# Patient Record
Sex: Male | Born: 1985 | Race: Black or African American | Hispanic: No | Marital: Single | Smoking: Current every day smoker
Health system: Southern US, Community
[De-identification: ages and names within clinical notes are randomized; demographics above are authoritative.]

## PROBLEM LIST (undated history)

## (undated) ENCOUNTER — Ambulatory Visit (HOSPITAL_COMMUNITY): Discharge status: Absent without leave | Payer: No Payment, Other

## (undated) DIAGNOSIS — I10 Essential (primary) hypertension: Secondary | ICD-10-CM

## (undated) DIAGNOSIS — F419 Anxiety disorder, unspecified: Secondary | ICD-10-CM

## (undated) HISTORY — PX: NO PAST SURGERIES: SHX2092

---
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
## 2006-06-20 ENCOUNTER — Emergency Department (HOSPITAL_COMMUNITY): Admission: EM | Admit: 2006-06-20 | Discharge: 2006-06-20 | Payer: Self-pay | Admitting: Emergency Medicine

## 2006-09-14 ENCOUNTER — Emergency Department (HOSPITAL_COMMUNITY): Admission: EM | Admit: 2006-09-14 | Discharge: 2006-09-14 | Payer: Self-pay | Admitting: Emergency Medicine

## 2006-09-16 ENCOUNTER — Emergency Department (HOSPITAL_COMMUNITY): Admission: EM | Admit: 2006-09-16 | Discharge: 2006-09-16 | Payer: Self-pay | Admitting: Emergency Medicine

## 2006-09-20 ENCOUNTER — Emergency Department (HOSPITAL_COMMUNITY): Admission: EM | Admit: 2006-09-20 | Discharge: 2006-09-20 | Payer: Self-pay | Admitting: Emergency Medicine

## 2006-12-11 ENCOUNTER — Emergency Department (HOSPITAL_COMMUNITY): Admission: EM | Admit: 2006-12-11 | Discharge: 2006-12-12 | Payer: Self-pay | Admitting: Emergency Medicine

## 2008-02-21 ENCOUNTER — Emergency Department (HOSPITAL_COMMUNITY): Admission: EM | Admit: 2008-02-21 | Discharge: 2008-02-21 | Payer: Self-pay | Admitting: Emergency Medicine

## 2009-09-10 ENCOUNTER — Emergency Department (HOSPITAL_COMMUNITY): Admission: EM | Admit: 2009-09-10 | Discharge: 2009-09-10 | Payer: Self-pay | Admitting: Family Medicine

## 2010-10-26 LAB — POCT RAPID STREP A (OFFICE): Streptococcus, Group A Screen (Direct): POSITIVE — AB

## 2011-05-05 LAB — CBC
HCT: 49.9
MCHC: 34
MCV: 89.8
Platelets: 177
RDW: 13.2

## 2011-05-05 LAB — URINALYSIS, ROUTINE W REFLEX MICROSCOPIC
Bilirubin Urine: NEGATIVE
Glucose, UA: NEGATIVE
Nitrite: NEGATIVE
Protein, ur: NEGATIVE
pH: 8

## 2011-05-05 LAB — COMPREHENSIVE METABOLIC PANEL
Albumin: 4
CO2: 23

## 2011-05-05 LAB — DIFFERENTIAL
Basophils Absolute: 0
Eosinophils Relative: 0
Lymphocytes Relative: 18
Lymphs Abs: 0.9
Monocytes Absolute: 0.4
Monocytes Relative: 8
Neutro Abs: 3.6

## 2011-05-05 LAB — LIPASE, BLOOD: Lipase: 13

## 2011-08-25 ENCOUNTER — Emergency Department (HOSPITAL_COMMUNITY)
Admission: EM | Admit: 2011-08-25 | Discharge: 2011-08-25 | Disposition: A | Discharge status: Home / Self Care | Payer: Self-pay | Attending: Emergency Medicine | Admitting: Emergency Medicine

## 2011-08-25 ENCOUNTER — Encounter (HOSPITAL_COMMUNITY): Payer: Self-pay | Admitting: *Deleted

## 2011-08-25 DIAGNOSIS — R6883 Chills (without fever): Secondary | ICD-10-CM | POA: Insufficient documentation

## 2011-08-25 DIAGNOSIS — R070 Pain in throat: Secondary | ICD-10-CM | POA: Insufficient documentation

## 2011-08-25 DIAGNOSIS — J3489 Other specified disorders of nose and nasal sinuses: Secondary | ICD-10-CM | POA: Insufficient documentation

## 2011-08-25 DIAGNOSIS — I10 Essential (primary) hypertension: Secondary | ICD-10-CM | POA: Insufficient documentation

## 2011-08-25 DIAGNOSIS — J069 Acute upper respiratory infection, unspecified: Secondary | ICD-10-CM | POA: Insufficient documentation

## 2011-08-25 HISTORY — DX: Essential (primary) hypertension: I10

## 2011-08-25 MED ORDER — GUAIFENESIN-CODEINE 100-10 MG/5ML PO SYRP: 5.0000 mL | ORAL_SOLUTION | Freq: Three times a day (TID) | ORAL | Status: AC | PRN

## 2011-08-25 NOTE — ED Notes (Signed)
Cold sorethroat and head coingestion since yesterday

## 2011-09-01 NOTE — ED Provider Notes (Signed)
History    26 year old male with sore throat. Gradual onset yesterday. Persistent since. Feels like his face and head is congested. No fever. Has had intermittent chills. No difficulty with breathing or swallowing. Denies chest pain or shortness of breath. No unusual rash. No sick contacts. No nausea or vomiting. Patient is a smoker. Denies any significant past medical history.  CSN: [id number]  Arrival date & time 08/25/11  1630   First MD Initiated Contact with Patient 08/25/11 1709      Chief Complaint  Patient presents with  . Chills    (Consider location/radiation/quality/duration/timing/severity/associated sxs/prior treatment) HPI  Past Medical History  Diagnosis Date  . Hypertension     History reviewed. No pertinent past surgical history.  History reviewed. No pertinent family history.  History  Substance Use Topics  . Smoking status: Current Everyday Smoker  . Smokeless tobacco: Not on file  . Alcohol Use: Yes      Review of Systems   Review of symptoms negative unless otherwise noted in HPI.   Allergies  Review of patient's allergies indicates no known allergies.  Home Medications   Current Outpatient Rx  Name Route Sig Dispense Refill  . GUAIFENESIN-CODEINE 100-10 MG/5ML PO SYRP Oral Take 5 mLs by mouth 3 (three) times daily as needed for cough. 120 mL 0    BP 108/72  Pulse 75  Temp(Src) 99 F (37.2 C) (Oral)  Resp 20  SpO2 100%  Physical Exam  Nursing note and vitals reviewed. Constitutional: He appears well-developed and well-nourished. No distress.       Sitting up in bed. No acute distress.  HENT:  Head: Normocephalic and atraumatic.  Right Ear: External ear normal.  Left Ear: External ear normal.  Nose: Nose normal.  Mouth/Throat: Oropharynx is clear and moist.       Tympanic membranes are normal bilaterally. Posterior pharynx is clear. Uvula is midline. Patient is handling secretions. There is no tongue elevation. No  concerning  intraoral lesions. Submental tissues are soft. Neck is supple. No adenopathy.  Eyes: Conjunctivae are normal. Pupils are equal, round, and reactive to light. Right eye exhibits no discharge. Left eye exhibits no discharge.  Neck: Normal range of motion. Neck supple.  Cardiovascular: Normal rate, regular rhythm and normal heart sounds.  Exam reveals no gallop and no friction rub.   No murmur heard. Pulmonary/Chest: Effort normal and breath sounds normal. No stridor. No respiratory distress.  Abdominal: Soft. He exhibits no distension. There is no tenderness.  Musculoskeletal: He exhibits no edema and no tenderness.  Lymphadenopathy:    He has no cervical adenopathy.  Neurological: He is alert.  Skin: Skin is warm and dry. He is not diaphoretic.  Psychiatric: He has a normal mood and affect. His behavior is normal. Thought content normal.    ED Course  Procedures (including critical care time)  Labs Reviewed - No data to display No results found.   1. URI (upper respiratory infection)       MDM  26 year old male with a sore throat and congestion. Suspect viral URI. Patient is very well appearing. No evidence of deep space neck infection. No respiratory distress. Plan symptomatic treatment. Return precautions discussed. Outpatient follow up as needed.        Raeford Razor, MD 09/01/11 [phone]

## 2012-06-16 ENCOUNTER — Encounter (HOSPITAL_COMMUNITY): Payer: Self-pay | Admitting: Emergency Medicine

## 2012-06-16 ENCOUNTER — Emergency Department (HOSPITAL_COMMUNITY): Payer: Self-pay

## 2012-06-16 ENCOUNTER — Emergency Department (HOSPITAL_COMMUNITY)
Admission: EM | Admit: 2012-06-16 | Discharge: 2012-06-16 | Disposition: A | Discharge status: Home / Self Care | Payer: Self-pay | Attending: Emergency Medicine | Admitting: Emergency Medicine

## 2012-06-16 DIAGNOSIS — F419 Anxiety disorder, unspecified: Secondary | ICD-10-CM

## 2012-06-16 DIAGNOSIS — F411 Generalized anxiety disorder: Secondary | ICD-10-CM | POA: Insufficient documentation

## 2012-06-16 DIAGNOSIS — R0602 Shortness of breath: Secondary | ICD-10-CM | POA: Insufficient documentation

## 2012-06-16 DIAGNOSIS — I10 Essential (primary) hypertension: Secondary | ICD-10-CM | POA: Insufficient documentation

## 2012-06-16 DIAGNOSIS — F17200 Nicotine dependence, unspecified, uncomplicated: Secondary | ICD-10-CM | POA: Insufficient documentation

## 2012-06-16 HISTORY — DX: Anxiety disorder, unspecified: F41.9

## 2012-06-16 MED ORDER — LORAZEPAM 1 MG PO TABS
0.5000 mg | ORAL_TABLET | Freq: Once | ORAL | Status: AC
  Administered 2012-06-16: 0.5 mg via ORAL
  Filled 2012-06-16: qty 1

## 2012-06-16 MED ORDER — LORAZEPAM 1 MG PO TABS: 0.5000 mg | ORAL_TABLET | Freq: Three times a day (TID) | ORAL | Status: DC | PRN

## 2012-06-16 NOTE — ED Provider Notes (Signed)
History     CSN: [id number]  Arrival date & time 06/16/12  4098   First MD Initiated Contact with Patient 06/16/12 0636      Chief Complaint  Patient presents with  . Shortness of Breath    (Consider location/radiation/quality/duration/timing/severity/associated sxs/prior treatment) HPI Comments: Patient presents with a chief complaint of shortness of breath that began acutely at approximately 3 AM. Patient has a history of anxiety and states that the symptoms felt similar however he was having numbness and tingling around his mouth and in his fingers as well as a sharp pain in his left chest that was increased with deep breathing. He states that he was wheezing. Patient states he took a puff from a friend's steroid inhaler. He states that this helped somewhat. He does not have a history of asthma. Symptoms have gradually improved but are not gone. Nothing makes symptoms worse. Patient denies recent fever, cold symptoms, heartburn history of heart problems, nausea, vomiting, abdominal pain, diarrhea. He was prescribed xanax in the past for anxiety but no longer takes this because 'it was too strong'.   Patient is a 26 y.o. male presenting with shortness of breath. The history is provided by the patient.  Shortness of Breath  Associated symptoms include shortness of breath. Pertinent negatives include no chest pain, no fever, no rhinorrhea, no sore throat and no cough.    Past Medical History  Diagnosis Date  . Hypertension   . Anxiety     History reviewed. No pertinent past surgical history.  No family history on file.  History  Substance Use Topics  . Smoking status: Current Every Day Smoker  . Smokeless tobacco: Not on file  . Alcohol Use: Yes      Review of Systems  Constitutional: Negative for fever.  HENT: Negative for sore throat and rhinorrhea.   Eyes: Negative for redness.  Respiratory: Positive for shortness of breath. Negative for cough.   Cardiovascular:  Negative for chest pain.  Gastrointestinal: Negative for nausea, vomiting, abdominal pain and diarrhea.  Genitourinary: Negative for dysuria.  Musculoskeletal: Negative for myalgias.  Skin: Negative for rash.  Neurological: Negative for headaches.    Allergies  Review of patient's allergies indicates no known allergies.  Home Medications  No current outpatient prescriptions on file.  BP 124/89  Pulse 75  Temp 98.8 F (37.1 C)  Resp 18  SpO2 100%  Physical Exam  Nursing note and vitals reviewed. Constitutional: He appears well-developed and well-nourished.  HENT:  Head: Normocephalic and atraumatic.  Eyes: Conjunctivae normal are normal. Right eye exhibits no discharge. Left eye exhibits no discharge.  Neck: Normal range of motion. Neck supple.  Cardiovascular: Normal rate, regular rhythm and normal heart sounds.   Pulmonary/Chest: Effort normal and breath sounds normal.  Abdominal: Soft. There is no tenderness.  Neurological: He is alert.  Skin: Skin is warm and dry.  Psychiatric: He has a normal mood and affect.    ED Course  Procedures (including critical care time)  Labs Reviewed - No data to display Dg Chest 2 View  06/16/2012  *RADIOLOGY REPORT*  Clinical Data: Shortness of breath since this morning.  Chest tightness.  History of smoking.  CHEST - 2 VIEW  Comparison: 09/14/2006  Findings: Heart size is normal.  The lungs are free of focal consolidations and pleural effusions.  No pulmonary edema. Visualized osseous structures have a normal appearance.  IMPRESSION: Negative exam.   Original Report Authenticated By: Norva Pavlov, M.D.  1. Shortness of breath   2. Anxiety     7:02 AM Patient seen and examined. Work-up initiated. Medications ordered.   Vital signs reviewed and are as follows: Filed Vitals:   06/16/12 0633  BP: 124/89  Pulse: 75  Temp: 98.8 F (37.1 C)  Resp: 18    Date: 06/16/2012  Rate: 76  Rhythm: normal sinus rhythm  QRS  Axis: normal  Intervals: normal  ST/T Wave abnormalities: early repolarization  Conduction Disutrbances:none  Narrative Interpretation: LVH  Old EKG Reviewed: none available  7:52 AM EKG and findings reviewed with Dr. Jeraldine Loots  Patient informed of results.   Patient urged to return with worsening symptoms, trouble breathing or other concerns. Patient verbalized understanding and agrees with plan.   7:59 AM Patient feels better after 0.5mg  ativan. Requests rx for home. Pt counseled on use. Urged PCP follow-up.     MDM  Symptoms likely 2/2 panic attack. CXR and EKG unremarkable. EKG does show LVH and early repolarization. CXR is neg with normal heart size so doubt HCM. Patient is young, athletic build and this is likely why EKG shows LVH. Patient otherwise appears well.       Renne Crigler, [place] 06/16/12 [phone]

## 2012-06-16 NOTE — ED Provider Notes (Signed)
Medical screening examination/treatment/procedure(s) were performed by non-physician practitioner and as supervising physician I was immediately available for consultation/collaboration.  Avinash Maltos, MD 06/16/12 1003 

## 2012-06-16 NOTE — ED Notes (Signed)
Patient reports waking up from sleeping having difficulty catching his breath. Patient describes the feeling as an anxiety attack. Patient does have Hx of anxiety and has been prescribed Xanax but does not take this medication because "they are too strong." Patient talking in complete sentences and ambulating.

## 2013-09-14 ENCOUNTER — Emergency Department (HOSPITAL_COMMUNITY)
Admission: EM | Admit: 2013-09-14 | Discharge: 2013-09-14 | Disposition: A | Discharge status: Home / Self Care | Payer: Self-pay | Attending: Emergency Medicine | Admitting: Emergency Medicine

## 2013-09-14 ENCOUNTER — Encounter (HOSPITAL_COMMUNITY): Payer: Self-pay | Admitting: Emergency Medicine

## 2013-09-14 DIAGNOSIS — F17200 Nicotine dependence, unspecified, uncomplicated: Secondary | ICD-10-CM | POA: Insufficient documentation

## 2013-09-14 DIAGNOSIS — F411 Generalized anxiety disorder: Secondary | ICD-10-CM | POA: Insufficient documentation

## 2013-09-14 DIAGNOSIS — J069 Acute upper respiratory infection, unspecified: Secondary | ICD-10-CM | POA: Insufficient documentation

## 2013-09-14 DIAGNOSIS — R0982 Postnasal drip: Secondary | ICD-10-CM | POA: Insufficient documentation

## 2013-09-14 DIAGNOSIS — R110 Nausea: Secondary | ICD-10-CM | POA: Insufficient documentation

## 2013-09-14 DIAGNOSIS — R61 Generalized hyperhidrosis: Secondary | ICD-10-CM | POA: Insufficient documentation

## 2013-09-14 DIAGNOSIS — I10 Essential (primary) hypertension: Secondary | ICD-10-CM | POA: Insufficient documentation

## 2013-09-14 MED ORDER — OXYMETAZOLINE HCL 0.05 % NA SOLN
1.0000 | Freq: Once | NASAL | Status: AC
  Administered 2013-09-14: 1 via NASAL
  Filled 2013-09-14: qty 15

## 2013-09-14 MED ORDER — PSEUDOEPHEDRINE HCL 30 MG PO TABS: 30.0000 mg | ORAL_TABLET | ORAL | Status: DC | PRN

## 2013-09-14 NOTE — ED Provider Notes (Signed)
CSN: [id number]     Arrival date & time 09/14/13  1313 History  This chart was scribed for Jaynie Crumbleatyana Atwell Mcdanel, PA, working with Celene KrasJon R. Knapp, MD, by Ellin MayhewMichael Levi, ED Scribe. This patient was seen in room TR06C/TR06C and the patient's care was started at 1:49 PM.  Chief Complaint  Patient presents with  . Nasal Congestion  . Sore Throat   The history is provided by the patient. No language interpreter was used.   HPI Comments: Allen Henson is a 28 y.o. male who presents to the Emergency Department complaining of constant, nonchanging nasal congestion with clear drainage and sore throat with onset 3 days ago. Additionally, patient reports feeling sinus pressure, nauseous, and having hot flashes/chills. He denies any fever or cough. Patient has tried taking Mucinex and Ibuprofen with no relief. Patient confirms getting sick from his daughter recently. He is a current smoker. Patient confirms having a history of panic attacks.   Past Medical History  Diagnosis Date  . Hypertension   . Anxiety    History reviewed. No pertinent past surgical history. No family history on file. History  Substance Use Topics  . Smoking status: Current Every Day Smoker  . Smokeless tobacco: Not on file  . Alcohol Use: Yes     Comment: almost everyday    Review of Systems  Constitutional: Positive for chills and diaphoresis. Negative for fever.  HENT: Positive for congestion, postnasal drip, sinus pressure and sore throat.   Respiratory: Negative for cough and shortness of breath.   Gastrointestinal: Positive for nausea. Negative for vomiting and diarrhea.  Musculoskeletal: Negative for back pain and neck pain.  Neurological: Negative for weakness.  All other systems reviewed and are negative.   Allergies  Review of patient's allergies indicates no known allergies.  Home Medications   Current Outpatient Rx  Name  Route  Sig  Dispense  Refill  . LORazepam (ATIVAN) 1 MG tablet   Oral   Take 0.5  tablets (0.5 mg total) by mouth 3 (three) times daily as needed for anxiety.   5 tablet   0    Triage Vitals: BP 133/68  Pulse 70  Temp(Src) 98.1 F (36.7 C) (Oral)  Resp 20  SpO2 100%  Physical Exam  Nursing note and vitals reviewed. Constitutional: He is oriented to person, place, and time. He appears well-developed and well-nourished. No distress.  HENT:  Head: Normocephalic and atraumatic.  Right Ear: Hearing and tympanic membrane normal.  Left Ear: Hearing and tympanic membrane normal.  Mouth/Throat: No oropharyngeal exudate or posterior oropharyngeal erythema.  Post nasal drainage. Sinus non tender.   Eyes: EOM are normal.  Neck: Normal range of motion. Neck supple.  Cardiovascular: Normal rate, regular rhythm and normal heart sounds.   Pulmonary/Chest: Effort normal and breath sounds normal. No respiratory distress.  Musculoskeletal: Normal range of motion.  Neurological: He is alert and oriented to person, place, and time.  Skin: Skin is warm and dry.  Psychiatric: He has a normal mood and affect. His behavior is normal.    ED Course  Procedures (including critical care time)  DIAGNOSTIC STUDIES: Oxygen Saturation is 100% on room air, normal by my interpretation.    COORDINATION OF CARE: 1:54 PM-Discussed my suspicion of a viral infection. Encouraged taking a decongestant and tylenol in addition to saline spray to help with sinus congestion. Also encouraged to rest and refrain from working until symptoms resolve. Treatment plan discussed with patient and patient agrees.  Labs Review Labs  Reviewed - No data to display Imaging Review No results found.  EKG Interpretation   None       MDM   1. Viral URI    Pt with nasal congestion, facial pain, sinus pressure. No fever. Afebrile here in ED. VS normal. No cough. Suspect viral URI. Home on decongestants. Afrin given in ed.   Filed Vitals:   09/14/13 1326 09/14/13 1424  BP: 133/68 115/73  Pulse: 70 65   Temp: 98.1 F (36.7 C)   TempSrc: Oral   Resp: 20   SpO2: 100% 100%     I personally performed the services described in this documentation, which was scribed in my presence. The recorded information has been reviewed and is accurate.    Lottie Mussel, PA-C 09/14/13 1614

## 2013-09-14 NOTE — Discharge Instructions (Signed)
Rest. Drink plenty of fluids. Use saline spray every 2 hrs. Afrin spray, that was given in ED, use one spray, twice a day, USE ONLY FOR 3 DAYS. Ibuprofen or tylenol for pain. Sudafed for congestion.   Upper Respiratory Infection, Adult An upper respiratory infection (URI) is also known as the common cold. It is often caused by a type of germ (virus). Colds are easily spread (contagious). You can pass it to others by kissing, coughing, sneezing, or drinking out of the same glass. Usually, you get better in 1 or 2 weeks.  HOME CARE   Only take medicine as told by your doctor.  Use a warm mist humidifier or breathe in steam from a hot shower.  Drink enough water and fluids to keep your pee (urine) clear or pale yellow.  Get plenty of rest.  Return to work when your temperature is back to normal or as told by your doctor. You may use a face mask and wash your hands to stop your cold from spreading. GET HELP RIGHT AWAY IF:   After the first few days, you feel you are getting worse.  You have questions about your medicine.  You have chills, shortness of breath, or brown or red spit (mucus).  You have yellow or brown snot (nasal discharge) or pain in the face, especially when you bend forward.  You have a fever, puffy (swollen) neck, pain when you swallow, or white spots in the back of your throat.  You have a bad headache, ear pain, sinus pain, or chest pain.  You have a high-pitched whistling sound when you breathe in and out (wheezing).  You have a lasting cough or cough up blood.  You have sore muscles or a stiff neck. MAKE SURE YOU:   Understand these instructions.  Will watch your condition.  Will get help right away if you are not doing well or get worse. Document Released: 01/10/2008 Document Revised: 10/16/2011 Document Reviewed: 11/28/2010 [facility] Patient Information 2014 [place], [place].

## 2013-09-14 NOTE — ED Notes (Signed)
Pt is here with head congestion and sore throat

## 2013-09-16 NOTE — ED Provider Notes (Signed)
Medical screening examination/treatment/procedure(s) were performed by non-physician practitioner and as supervising physician I was immediately available for consultation/collaboration.    Kline Bulthuis R Shiza Thelen, MD 09/16/13 1436 

## 2014-01-15 ENCOUNTER — Emergency Department (HOSPITAL_COMMUNITY)
Admission: EM | Admit: 2014-01-15 | Discharge: 2014-01-15 | Discharge status: Against Medical Advice | Payer: Self-pay | Attending: Emergency Medicine | Admitting: Emergency Medicine

## 2014-01-15 ENCOUNTER — Encounter (HOSPITAL_COMMUNITY): Payer: Self-pay | Admitting: Emergency Medicine

## 2014-01-15 DIAGNOSIS — I10 Essential (primary) hypertension: Secondary | ICD-10-CM | POA: Insufficient documentation

## 2014-01-15 DIAGNOSIS — Z87891 Personal history of nicotine dependence: Secondary | ICD-10-CM | POA: Insufficient documentation

## 2014-01-15 DIAGNOSIS — F410 Panic disorder [episodic paroxysmal anxiety] without agoraphobia: Secondary | ICD-10-CM | POA: Insufficient documentation

## 2014-01-15 LAB — CBC
HCT: 45.7 % (ref 39.0–52.0)
HEMOGLOBIN: 15.1 g/dL (ref 13.0–17.0)
MCH: 29.7 pg (ref 26.0–34.0)
MCHC: 33 g/dL (ref 30.0–36.0)
MCV: 89.8 fL (ref 78.0–100.0)
PLATELETS: 198 10*3/uL (ref 150–400)
RBC: 5.09 MIL/uL (ref 4.22–5.81)
RDW: 12.8 % (ref 11.5–15.5)
WBC: 4.8 10*3/uL (ref 4.0–10.5)

## 2014-01-15 LAB — I-STAT TROPONIN, ED: Troponin i, poc: 0.01 ng/mL (ref 0.00–0.08)

## 2014-01-15 NOTE — ED Notes (Signed)
Pt with hx of panic attacks feels as if he is having one again.  States he feels like his chest is "ripping".  States ran out of anxiety meds.

## 2014-01-15 NOTE — ED Notes (Signed)
Pt has to work and he is going home.

## 2014-04-15 ENCOUNTER — Encounter (HOSPITAL_COMMUNITY): Payer: Self-pay | Admitting: Behavioral Health

## 2014-04-15 ENCOUNTER — Emergency Department (HOSPITAL_COMMUNITY)
Admission: EM | Admit: 2014-04-15 | Discharge: 2014-04-15 | Disposition: A | Discharge status: Home / Self Care | Payer: Self-pay | Attending: Emergency Medicine | Admitting: Emergency Medicine

## 2014-04-15 ENCOUNTER — Observation Stay (HOSPITAL_COMMUNITY)
Admission: AD | Admit: 2014-04-15 | Discharge: 2014-04-16 | Disposition: A | Discharge status: Home / Self Care | Payer: Self-pay | Source: Intra-hospital | Attending: Family | Admitting: Family

## 2014-04-15 ENCOUNTER — Encounter (HOSPITAL_COMMUNITY): Payer: Self-pay | Admitting: Emergency Medicine

## 2014-04-15 DIAGNOSIS — F1210 Cannabis abuse, uncomplicated: Secondary | ICD-10-CM | POA: Insufficient documentation

## 2014-04-15 DIAGNOSIS — F4325 Adjustment disorder with mixed disturbance of emotions and conduct: Principal | ICD-10-CM | POA: Insufficient documentation

## 2014-04-15 DIAGNOSIS — F911 Conduct disorder, childhood-onset type: Secondary | ICD-10-CM | POA: Insufficient documentation

## 2014-04-15 DIAGNOSIS — R454 Irritability and anger: Secondary | ICD-10-CM

## 2014-04-15 DIAGNOSIS — I10 Essential (primary) hypertension: Secondary | ICD-10-CM | POA: Insufficient documentation

## 2014-04-15 DIAGNOSIS — R45850 Homicidal ideations: Secondary | ICD-10-CM

## 2014-04-15 DIAGNOSIS — Z87891 Personal history of nicotine dependence: Secondary | ICD-10-CM | POA: Insufficient documentation

## 2014-04-15 DIAGNOSIS — F411 Generalized anxiety disorder: Secondary | ICD-10-CM | POA: Insufficient documentation

## 2014-04-15 DIAGNOSIS — F3289 Other specified depressive episodes: Secondary | ICD-10-CM | POA: Insufficient documentation

## 2014-04-15 DIAGNOSIS — F1410 Cocaine abuse, uncomplicated: Secondary | ICD-10-CM | POA: Insufficient documentation

## 2014-04-15 DIAGNOSIS — Z91040 Latex allergy status: Secondary | ICD-10-CM | POA: Insufficient documentation

## 2014-04-15 DIAGNOSIS — R112 Nausea with vomiting, unspecified: Secondary | ICD-10-CM | POA: Insufficient documentation

## 2014-04-15 DIAGNOSIS — R630 Anorexia: Secondary | ICD-10-CM | POA: Insufficient documentation

## 2014-04-15 DIAGNOSIS — F329 Major depressive disorder, single episode, unspecified: Secondary | ICD-10-CM | POA: Insufficient documentation

## 2014-04-15 DIAGNOSIS — F32A Depression, unspecified: Secondary | ICD-10-CM

## 2014-04-15 LAB — URINE MICROSCOPIC-ADD ON

## 2014-04-15 LAB — COMPREHENSIVE METABOLIC PANEL
ALBUMIN: 4.4 g/dL (ref 3.5–5.2)
ALT: 18 U/L (ref 0–53)
AST: 20 U/L (ref 0–37)
Alkaline Phosphatase: 54 U/L (ref 39–117)
Anion gap: 17 — ABNORMAL HIGH (ref 5–15)
BILIRUBIN TOTAL: 0.5 mg/dL (ref 0.3–1.2)
BUN: 10 mg/dL (ref 6–23)
CALCIUM: 9.3 mg/dL (ref 8.4–10.5)
CHLORIDE: 102 meq/L (ref 96–112)
CO2: 20 mEq/L (ref 19–32)
Creatinine, Ser: 1.12 mg/dL (ref 0.50–1.35)
GFR calc Af Amer: >90 mL/min (ref >90)
GFR calc non Af Amer: 89 mL/min — ABNORMAL LOW (ref >90)
GLUCOSE: 108 mg/dL — AB (ref 70–99)
POTASSIUM: 4.3 meq/L (ref 3.7–5.3)
Sodium: 139 mEq/L (ref 137–147)
Total Protein: 7.7 g/dL (ref 6.0–8.3)

## 2014-04-15 LAB — CBC WITH DIFFERENTIAL/PLATELET
Basophils Absolute: 0 10*3/uL (ref 0.0–0.1)
Basophils Relative: 0 % (ref 0–1)
EOS PCT: 0 % (ref 0–5)
Eosinophils Absolute: 0 10*3/uL (ref 0.0–0.7)
HCT: 49.6 % (ref 39.0–52.0)
HEMOGLOBIN: 16.4 g/dL (ref 13.0–17.0)
LYMPHS ABS: 1 10*3/uL (ref 0.7–4.0)
Lymphocytes Relative: 16 % (ref 12–46)
MCH: 29.6 pg (ref 26.0–34.0)
MCHC: 33.1 g/dL (ref 30.0–36.0)
MCV: 89.5 fL (ref 78.0–100.0)
Monocytes Absolute: 0.3 10*3/uL (ref 0.1–1.0)
Monocytes Relative: 6 % (ref 3–12)
Neutro Abs: 4.8 10*3/uL (ref 1.7–7.7)
Neutrophils Relative %: 78 % — ABNORMAL HIGH (ref 43–77)
PLATELETS: 184 10*3/uL (ref 150–400)
RBC: 5.54 MIL/uL (ref 4.22–5.81)
RDW: 12.5 % (ref 11.5–15.5)
WBC: 6.1 10*3/uL (ref 4.0–10.5)

## 2014-04-15 LAB — RAPID URINE DRUG SCREEN, HOSP PERFORMED
Amphetamines: NOT DETECTED
BARBITURATES: NOT DETECTED
BENZODIAZEPINES: NOT DETECTED
COCAINE: POSITIVE — AB
OPIATES: NOT DETECTED
TETRAHYDROCANNABINOL: POSITIVE — AB

## 2014-04-15 LAB — URINALYSIS, ROUTINE W REFLEX MICROSCOPIC
Bilirubin Urine: NEGATIVE
Glucose, UA: NEGATIVE mg/dL
Hgb urine dipstick: NEGATIVE
Ketones, ur: >80 mg/dL — AB
Nitrite: NEGATIVE
Protein, ur: NEGATIVE mg/dL
Specific Gravity, Urine: 1.029 (ref 1.005–1.030)
UROBILINOGEN UA: 0.2 mg/dL (ref 0.0–1.0)
pH: 5.5 (ref 5.0–8.0)

## 2014-04-15 LAB — ETHANOL

## 2014-04-15 LAB — HIV ANTIBODY (ROUTINE TESTING W REFLEX): HIV 1&2 Ab, 4th Generation: NONREACTIVE

## 2014-04-15 MED ORDER — ONDANSETRON 4 MG PO TBDP
4.0000 mg | ORAL_TABLET | Freq: Once | ORAL | Status: AC
  Administered 2014-04-15: 4 mg via ORAL
  Filled 2014-04-15: qty 1

## 2014-04-15 MED ORDER — ZOLPIDEM TARTRATE 5 MG PO TABS: 5.0000 mg | ORAL_TABLET | Freq: Every evening | ORAL | Status: DC | PRN

## 2014-04-15 MED ORDER — ACETAMINOPHEN 325 MG PO TABS: 650.0000 mg | ORAL_TABLET | ORAL | Status: DC | PRN

## 2014-04-15 MED ORDER — ONDANSETRON HCL 4 MG PO TABS: 4.0000 mg | ORAL_TABLET | Freq: Three times a day (TID) | ORAL | Status: DC | PRN

## 2014-04-15 MED ORDER — IBUPROFEN 600 MG PO TABS: 600.0000 mg | ORAL_TABLET | Freq: Three times a day (TID) | ORAL | Status: DC | PRN

## 2014-04-15 MED ORDER — MAGNESIUM HYDROXIDE 400 MG/5ML PO SUSP: 30.0000 mL | Freq: Every day | ORAL | Status: DC | PRN

## 2014-04-15 MED ORDER — HYDROXYZINE HCL 25 MG PO TABS: 25.0000 mg | ORAL_TABLET | Freq: Four times a day (QID) | ORAL | Status: DC | PRN

## 2014-04-15 MED ORDER — NICOTINE 21 MG/24HR TD PT24: 21.0000 mg | MEDICATED_PATCH | Freq: Every day | TRANSDERMAL | Status: DC

## 2014-04-15 MED ORDER — ALUM & MAG HYDROXIDE-SIMETH 200-200-20 MG/5ML PO SUSP: 30.0000 mL | ORAL | Status: DC | PRN

## 2014-04-15 MED ORDER — IBUPROFEN 200 MG PO TABS
600.0000 mg | ORAL_TABLET | Freq: Three times a day (TID) | ORAL | Status: DC | PRN
Start: 2014-04-15 — End: 2014-04-15

## 2014-04-15 MED ORDER — TRAZODONE HCL 50 MG PO TABS: 50.0000 mg | ORAL_TABLET | Freq: Every evening | ORAL | Status: DC | PRN

## 2014-04-15 MED ORDER — LORAZEPAM 1 MG PO TABS
1.0000 mg | ORAL_TABLET | Freq: Once | ORAL | Status: AC
  Administered 2014-04-15: 1 mg via ORAL
  Filled 2014-04-15: qty 1

## 2014-04-15 MED ORDER — NICOTINE 21 MG/24HR TD PT24
21.0000 mg | MEDICATED_PATCH | Freq: Every day | TRANSDERMAL | Status: DC
  Filled 2014-04-15 (×3): qty 1

## 2014-04-15 NOTE — ED Notes (Signed)
Called staffing for a sitter, they will work on finding one for him.

## 2014-04-15 NOTE — Plan of Care (Signed)
[facility] Observation Crisis Plan  Reason for Crisis Plan:  Crisis Stabilization   Plan of Care:  Referral for IOP  Family Support:   Mother and the mother of his two children  Current Living Environment:  Living Arrangements: Spouse/significant other;Parent  Insurance:   Hospital Account   Name Acct ID Class Status Primary Coverage   Allen Henson, Allen Henson [id number] BEHAVIORAL HEALTH OBSERVATION Open None        Guarantor Account (for Hospital Account [id number])   Name Relation to Pt Service Area Active? Acct Type   Allen Henson Self [facility] Yes Behavioral Health   Address Phone       [address] [place], [place] [address] [phone](H)          Coverage Information (for Hospital Account [id number])   Not on file      Legal Guardian:     Primary Care Provider:  No PCP Per Patient  Current Outpatient Providers:  none at this time  Psychiatrist:     Counselor/Therapist:     Compliant with Medications:  No  Additional Information:   Allen Henson 9/9/201511:19 PM

## 2014-04-15 NOTE — ED Notes (Signed)
Attempted report to pod C

## 2014-04-15 NOTE — ED Notes (Signed)
Pt's mother sts "I want to have him seen by a counselor, he just broke up with his baby mama and they have been together for a long time so she was like family".

## 2014-04-15 NOTE — ED Notes (Signed)
Called [facility] to see ETA and [facility] stated they were behind and will be another 1 1/2 hours before transport available.
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
## 2014-04-15 NOTE — Discharge Instructions (Signed)
YOU ARE BEING TRANSPORTED FOR ONGOING PSYCHIATRIC CARE.
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
## 2014-04-15 NOTE — ED Notes (Signed)
[facility] called to say they were on their way.

## 2014-04-15 NOTE — Progress Notes (Signed)
[facility] INPATIENT:  Family/Significant Other Suicide Prevention Education  Suicide Prevention Education:  Patient Refusal for Family/Significant Other Suicide Prevention Education: The patient Allen Allen has refused to provide written consent for family/significant other to be provided Family/Significant Other Suicide Prevention Education during admission and/or prior to discharge.   Angeline Slim M 04/15/2014, 11:33 PM

## 2014-04-15 NOTE — ED Notes (Addendum)
Pt c/o lower abd pain, nausea and diarrhea x 2 days, sts he isn't really able to keep much down, denies any friends/family members with similar symptoms. Pt c/o feeling depressed and anxious. Reports difficulty sleeping/eating because of this, reports he just wants to sit around and no nothing, sts " I just feel down". Denies SI/HI. Reports he used to take xanax and ativan for anxiety but hasn't taken it in a few years. Denies currently seeing anyone for depression/anxiety. Pt reports when he gets really mad then he gets anxious. Nad, skin warm and dry, resp e/u.
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
## 2014-04-15 NOTE — ED Notes (Signed)
Security aware of need to wand pt.
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
## 2014-04-15 NOTE — BH Assessment (Signed)
Per Conrad,NP - patient meets criteria for OBS.  Patient accepted to OBS Bed 3.  Writer informed the ER MD and nurse. The nurse will arrange transportation through [facility].  The number [phone] or [phone].  Writer faxed support paperwork to [facility].

## 2014-04-15 NOTE — ED Notes (Signed)
Pt here with mother with increased anxiety and N/V; pt recent break up with significant other and having increased anger towards person that involved in break up; pt with poor eye contact and hx of anxiety per mother; pt denies SI/HI
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
## 2014-04-16 DIAGNOSIS — F4325 Adjustment disorder with mixed disturbance of emotions and conduct: Principal | ICD-10-CM

## 2014-04-16 LAB — GC/CHLAMYDIA PROBE AMP
CT PROBE, AMP APTIMA: POSITIVE — AB
GC PROBE AMP APTIMA: NEGATIVE

## 2014-04-16 NOTE — Consult Note (Signed)
Case discussed, agree with plan 

## 2014-04-16 NOTE — Discharge Instructions (Signed)
For your ongoing behavioral health needs you may benefit from contacting one of the following agencies for outpatient treatment:       [facility]      [address]      [place], [place] [address]      [phone]       [facility]      [address]      [place], [place] [address]      [phone]      Please note: they also have an office in [place].  Call for appointment and for location of [facility] office

## 2014-04-16 NOTE — Progress Notes (Signed)
Patient ID: Allen Henson, male   DOB: 1986/03/13, 27 y.o.   MRN: [id number] Discharge Note-Seen this am by Renata Caprice NP and he has determined that client is safe and will be discharged today. He has already called his Mom to transport him home He states he felt better yesterday while still in ED because his baby's Momma visited him in the ED yesterday and they resolved their conflict. He denies being dangerous to anyone or himself now.He denies any offer to follow up with an outpatient provider, states he doesn't need it.All property returned to him and escorted to lobby to wait for his cab which his mom arranged for him. No prescriptions sent home with him. He asked Sanjuan Dame dissposition coordinator for outpatient referrals and they were given to him. Reviewed with him his outpatient referrals and he verbalized his understanding.

## 2014-04-16 NOTE — Progress Notes (Signed)
Patient ID: Allen Henson, male   DOB: 05-08-1986, 28 y.o.   MRN: [id number] D-States on initial contact that he is going home today before noon, and he was told that by the guy that admitted him here. He states he is here because he just needed to rest a night, because he was upset re relational issues. He states he has somewhere to live, is working as a Scientist, physiological and has 300 dollars in the locker to help him get home. His affect is bright, he is pleasant. He is denying any thoughts to hurt self or others today. A-Support offered. He denies any outpatient treatment and isnt interested in getting any at this time. No medications ordered. Monitored for safety. 0-Waiting on the Dr. To see him and recommended dissposition.
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
## 2014-04-16 NOTE — ED Provider Notes (Signed)
Medical screening examination/treatment/procedure(s) were performed by non-physician practitioner and as supervising physician I was immediately available for consultation/collaboration.   EKG Interpretation None        Pao Haffey, MD 04/16/14 0738 

## 2014-04-16 NOTE — Discharge Summary (Signed)
[facility] UNIT DISCHARGE SUMMARY & SRA   *Pt seen once in OBS due to short length of stay  Reason for Consult:  Homicidal Ideation Referring Physician:  EDP Allen Henson is an 28 y.o. male. Total Time spent with patient: 45 minutes  Assessment: AXIS I:  Adjustment Disorder with Mixed Disturbance of Emotions and Conduct AXIS II:  Deferred AXIS III:   Past Medical History  Diagnosis Date  . Hypertension   . Anxiety    AXIS IV:  other psychosocial or environmental problems, problems related to legal system/crime and problems related to social environment AXIS V:  61-70 mild symptoms  Plan:  No evidence of imminent risk to self or others at present.   Patient does not meet criteria for psychiatric inpatient admission. Supportive therapy provided about ongoing stressors. Refer to IOP. Discussed crisis plan, support from social network, calling 911, coming to the Emergency Department, and calling Suicide Hotline.    Subjective:   Allen Henson is a 28 y.o. male patient admitted with Homicidal Ideation towards the mother of his child and the person in a relationship with her. Pt denies SI, HI, and AVH, contracts for safety. Pt reports that he "worked everything out with my baby's mama and we are doing very well. I'm feeling much better". Pt spent the night in the OBS UNIT and is stable for discharge with outpatient referrals.   HPI:  Patient to the ER with complaints of anxiety, butterflies in his stomach, anger, anorexia with nausea and vomiting. The patient says that he was unfaithful to his babys mother last year. Four days ago the girl he slept with came for and told his babys mother about the incident last year. He says that his baby mother left him now and keeps telling him that she is seeing someone else now. He says that he has been to prison more than once because he has a bad temper and anger issues. He is concerned right now that if he leaves he will find the guy she is dating  or the girl he slept with last year and physically harm them. He reports not being able to go home because he is so upset and feels like he can't breathe and that his stomach is in knots. Pt denies SI but admits to wanting to harm others, anxiety and depression. Denies substance abuse.   HPI Elements:   Location:  Psychiatric. Quality:  Worsening. Severity:  Severe. Timing:  Constant. Duration:  New onset, persisting. Context:  Exacerbation of underlying concerns with anger management (pt reports 5 anger management courses).  Past Psychiatric History: Past Medical History  Diagnosis Date  . Hypertension   . Anxiety     reports that he quit smoking about 6 months ago. He does not have any smokeless tobacco history on file. He reports that he drinks alcohol. He reports that he uses illicit drugs (Marijuana). History reviewed. No pertinent family history.   Living Arrangements: Spouse/significant other;Parent   Abuse/Neglect [facility]) Physical Abuse: Denies Verbal Abuse: Denies Sexual Abuse: Denies Allergies:   Allergies  Allergen Reactions  . Latex Hives and Other (See Comments)    Headache    ACT Assessment Complete:  Yes:    Educational Status    Risk to Self: Risk to self with the past 6 months Is patient at risk for suicide?: No  Risk to Others:    Abuse: Abuse/Neglect Assessment (Assessment to be complete while patient is alone) Physical Abuse: Denies Verbal Abuse:  Denies Sexual Abuse: Denies Exploitation of patient/patient's resources: Denies Self-Neglect: Denies  Prior Inpatient Therapy:    Prior Outpatient Therapy:    Additional Information:                    Objective: Blood pressure 108/71, pulse 59, temperature 98.3 F (36.8 C), temperature source Oral, resp. rate 18, height 5' 9"  (1.753 m), weight 80.74 kg (178 lb), SpO2 100.00%.Body mass index is 26.27 kg/(m^2). Results for orders placed during the hospital encounter of 04/15/14 (from the past  72 hour(s))  URINALYSIS, ROUTINE W REFLEX MICROSCOPIC     Status: Abnormal   Collection Time    04/15/14 11:33 AM      Result Value Ref Range   Color, Urine AMBER (*) YELLOW   Comment: BIOCHEMICALS MAY BE AFFECTED BY COLOR   APPearance CLOUDY (*) CLEAR   Specific Gravity, Urine 1.029  1.005 - 1.030   pH 5.5  5.0 - 8.0   Glucose, UA NEGATIVE  NEGATIVE mg/dL   Hgb urine dipstick NEGATIVE  NEGATIVE   Bilirubin Urine NEGATIVE  NEGATIVE   Ketones, ur >80 (*) NEGATIVE mg/dL   Protein, ur NEGATIVE  NEGATIVE mg/dL   Urobilinogen, UA 0.2  0.0 - 1.0 mg/dL   Nitrite NEGATIVE  NEGATIVE   Leukocytes, UA SMALL (*) NEGATIVE  URINE MICROSCOPIC-ADD ON     Status: None   Collection Time    04/15/14 11:33 AM      Result Value Ref Range   Squamous Epithelial / LPF RARE  RARE   WBC, UA 7-10  <3 WBC/hpf   Bacteria, UA RARE  RARE   Urine-Other MUCOUS PRESENT    GC/CHLAMYDIA PROBE AMP     Status: Abnormal   Collection Time    04/15/14 12:42 PM      Result Value Ref Range   CT Probe RNA POSITIVE (*) NEGATIVE   Comment: (NOTE)     A Positive CT or NG Nucleic Acid Amplification Test (NAAT) result     should be considered presumptive evidence of infection.  The result     should be evaluated along with physical examination and other     diagnostic findings.   GC Probe RNA NEGATIVE  NEGATIVE   Comment: (NOTE)                                                                                               **Normal Reference Range: Negative**          Assay performed using the Gen-Probe APTIMA COMBO2 (R) Assay.     Acceptable specimen types for this assay include APTIMA Swabs (Unisex,     endocervical, urethral, or vaginal), first void urine, and ThinPrep     liquid based cytology samples.     Performed at [facility]     Status: None   Collection Time    04/15/14 12:42 PM      Result Value Ref Range   Alcohol, Ethyl (B) <11  0 - 11 mg/dL   Comment:  LOWEST DETECTABLE  LIMIT FOR     SERUM ALCOHOL IS 11 mg/dL     FOR MEDICAL PURPOSES ONLY  COMPREHENSIVE METABOLIC PANEL     Status: Abnormal   Collection Time    04/15/14 12:42 PM      Result Value Ref Range   Sodium 139  137 - 147 mEq/L   Potassium 4.3  3.7 - 5.3 mEq/L   Chloride 102  96 - 112 mEq/L   CO2 20  19 - 32 mEq/L   Glucose, Bld 108 (*) 70 - 99 mg/dL   BUN 10  6 - 23 mg/dL   Creatinine, Ser 1.12  0.50 - 1.35 mg/dL   Calcium 9.3  8.4 - 10.5 mg/dL   Total Protein 7.7  6.0 - 8.3 g/dL   Albumin 4.4  3.5 - 5.2 g/dL   AST 20  0 - 37 U/L   ALT 18  0 - 53 U/L   Alkaline Phosphatase 54  39 - 117 U/L   Total Bilirubin 0.5  0.3 - 1.2 mg/dL   GFR calc non Af Amer 89 (*) >90 mL/min   GFR calc Af Amer >90  >90 mL/min   Comment: (NOTE)     The eGFR has been calculated using the CKD EPI equation.     This calculation has not been validated in all clinical situations.     eGFR's persistently <90 mL/min signify possible Chronic Kidney     Disease.   Anion gap 17 (*) 5 - 15  CBC WITH DIFFERENTIAL     Status: Abnormal   Collection Time    04/15/14 12:42 PM      Result Value Ref Range   WBC 6.1  4.0 - 10.5 K/uL   RBC 5.54  4.22 - 5.81 MIL/uL   Hemoglobin 16.4  13.0 - 17.0 g/dL   HCT 49.6  39.0 - 52.0 %   MCV 89.5  78.0 - 100.0 fL   MCH 29.6  26.0 - 34.0 pg   MCHC 33.1  30.0 - 36.0 g/dL   RDW 12.5  11.5 - 15.5 %   Platelets 184  150 - 400 K/uL   Neutrophils Relative % 78 (*) 43 - 77 %   Neutro Abs 4.8  1.7 - 7.7 K/uL   Lymphocytes Relative 16  12 - 46 %   Lymphs Abs 1.0  0.7 - 4.0 K/uL   Monocytes Relative 6  3 - 12 %   Monocytes Absolute 0.3  0.1 - 1.0 K/uL   Eosinophils Relative 0  0 - 5 %   Eosinophils Absolute 0.0  0.0 - 0.7 K/uL   Basophils Relative 0  0 - 1 %   Basophils Absolute 0.0  0.0 - 0.1 K/uL  HIV ANTIBODY (ROUTINE TESTING)     Status: None   Collection Time    04/15/14 12:42 PM      Result Value Ref Range   HIV 1&2 Ab, 4th Generation NONREACTIVE  NONREACTIVE   Comment: (NOTE)      A NONREACTIVE HIV Ag/Ab result does not exclude HIV infection since     the time frame for seroconversion is variable. If acute HIV infection     is suspected, a HIV-1 RNA Qualitative TMA test is recommended.     HIV-1/2 Antibody Diff         Not indicated.     HIV-1 RNA, Qual TMA           Not indicated.  PLEASE NOTE: This information has been disclosed to you from records     whose confidentiality may be protected by state law. If your state     requires such protection, then the state law prohibits you from making     any further disclosure of the information without the specific written     consent of the person to whom it pertains, or as otherwise permitted     by law. A general authorization for the release of medical or other     information is NOT sufficient for this purpose.     The performance of this assay has not been clinically validated in     patients less than 48 years old.     Performed at [facility] (HOSP PERFORMED)     Status: Abnormal   Collection Time    04/15/14  1:33 PM      Result Value Ref Range   Opiates NONE DETECTED  NONE DETECTED   Cocaine POSITIVE (*) NONE DETECTED   Benzodiazepines NONE DETECTED  NONE DETECTED   Amphetamines NONE DETECTED  NONE DETECTED   Tetrahydrocannabinol POSITIVE (*) NONE DETECTED   Barbiturates NONE DETECTED  NONE DETECTED   Comment:            DRUG SCREEN FOR MEDICAL PURPOSES     ONLY.  IF CONFIRMATION IS NEEDED     FOR ANY PURPOSE, NOTIFY LAB     WITHIN 5 DAYS.                LOWEST DETECTABLE LIMITS     FOR URINE DRUG SCREEN     Drug Class       Cutoff (ng/mL)     Amphetamine      1000     Barbiturate      200     Benzodiazepine   702     Tricyclics       637     Opiates          300     Cocaine          300     THC              50   Labs are reviewed and are pertinent for N/A.  Current Facility-Administered Medications  Medication Dose Route Frequency Provider Last Rate  Last Dose  . acetaminophen (TYLENOL) tablet 650 mg  650 mg Oral Q4H PRN Benjamine Mola, FNP      . alum & mag hydroxide-simeth (MAALOX/MYLANTA) 200-200-20 MG/5ML suspension 30 mL  30 mL Oral PRN Benjamine Mola, FNP      . hydrOXYzine (ATARAX/VISTARIL) tablet 25 mg  25 mg Oral Q6H PRN Benjamine Mola, FNP      . ibuprofen (ADVIL,MOTRIN) tablet 600 mg  600 mg Oral Q8H PRN Benjamine Mola, FNP      . magnesium hydroxide (MILK OF MAGNESIA) suspension 30 mL  30 mL Oral Daily PRN Benjamine Mola, FNP      . nicotine (NICODERM CQ - dosed in mg/24 hours) patch 21 mg  21 mg Transdermal Daily Benjamine Mola, FNP      . ondansetron (ZOFRAN) tablet 4 mg  4 mg Oral Q8H PRN Benjamine Mola, FNP      . traZODone (DESYREL) tablet 50 mg  50 mg Oral QHS PRN Benjamine Mola, FNP        Psychiatric Specialty Exam:  Blood pressure 108/71, pulse 59, temperature 98.3 F (36.8 C), temperature source Oral, resp. rate 18, height 5' 9"  (1.753 m), weight 80.74 kg (178 lb), SpO2 100.00%.Body mass index is 26.27 kg/(m^2).  General Appearance: Casual  Eye Contact::  Good  Speech:  Clear and Coherent  Volume:  Normal  Mood:  Euthymic  Affect:  Appropriate and Congruent  Thought Process:  Coherent and Goal Directed  Orientation:  Full (Time, Place, and Person)  Thought Content:  WDL  Suicidal Thoughts:  No  Homicidal Thoughts:  No  Memory:  Immediate;   Good Recent;   Good Remote;   Good  Judgement:  Fair  Insight:  Fair  Psychomotor Activity:  Normal  Concentration:  Good  Recall:  Good  Fund of Knowledge:Fair  Language: Good  Akathisia:  No  Handed:    AIMS (if indicated):     Assets:  Resilience Social Support  Sleep:      Musculoskeletal: Strength & Muscle Tone: within normal limits Gait & Station: normal Patient leans: N/A    Suicide Risk Assessment     Nursing information obtained from:  Patient Demographic factors:  Male;Unemployed Current Mental Status:  Thoughts of violence towards  others (prior to admission to obs) Loss Factors:  NA Historical Factors:  NA Risk Reduction Factors:  Responsible for children under 58 years of age;Sense of responsibility to family;Religious beliefs about death;Living with another person, especially a relative;Positive social support;Positive therapeutic relationship Total Time spent with patient: 45 minutes  CLINICAL FACTORS:   Depression:   Impulsivity Insomnia  Psychiatric Specialty Exam:     Blood pressure 108/71, pulse 59, temperature 98.3 F (36.8 C), temperature source Oral, resp. rate 18, height 5' 9"  (1.753 m), weight 80.74 kg (178 lb), SpO2 100.00%.Body mass index is 26.27 kg/(m^2).  SEE PSE ABOVE  COGNITIVE FEATURES THAT CONTRIBUTE TO RISK:  Closed-mindedness    SUICIDE RISK:   Minimal: No identifiable suicidal ideation.  Patients presenting with no risk factors but with morbid ruminations; may be classified as minimal risk based on the severity of the depressive symptoms  Treatment Plan Summary: -Discharge home with outpatient referrals to counseling/psychiatry.   Benjamine Mola, FNP-BC 04/16/2014 11:14 AM  *Case reviewed with Dr. Sabra Heck.  Personally evaluated the patient and agree with assessment and plan Geralyn Flash A. Sabra Heck, M.D.

## 2014-04-16 NOTE — H&P (Signed)
[place] OBS UNIT H&P  Reason for Consult:  Homicidal Ideation Referring Physician:  EDP Allen Henson is an 28 y.o. male. Total Time spent with patient: 25 minutes  Assessment: AXIS I:  Adjustment Disorder with Mixed Disturbance of Emotions and Conduct AXIS II:  Deferred AXIS III:   Past Medical History  Diagnosis Date  . Hypertension   . Anxiety    AXIS IV:  other psychosocial or environmental problems, problems related to legal system/crime and problems related to social environment AXIS V:  61-70 mild symptoms  Plan:  No evidence of imminent risk to self or others at present.   Patient does not meet criteria for psychiatric inpatient admission. Supportive therapy provided about ongoing stressors. Refer to IOP. Discussed crisis plan, support from social network, calling 911, coming to the Emergency Department, and calling Suicide Hotline.    Subjective:   Allen Henson is a 28 y.o. male patient admitted with Homicidal Ideation towards the mother of his child and the person in a relationship with her. Pt denies SI, HI, and AVH, contracts for safety. Pt reports that he "worked everything out with my baby's mama and we are doing very well. I'm feeling much better". Pt spent the night in the OBS UNIT and is stable for discharge with outpatient referrals.   HPI:  Patient to the ER with complaints of anxiety, butterflies in his stomach, anger, anorexia with nausea and vomiting. The patient says that he was unfaithful to his babys mother last year. Four days ago the girl he slept with came for and told his babys mother about the incident last year. He says that his baby mother left him now and keeps telling him that she is seeing someone else now. He says that he has been to prison more than once because he has a bad temper and anger issues. He is concerned right now that if he leaves he will find the guy she is dating or the girl he slept with last year and physically harm them. He reports  not being able to go home because he is so upset and feels like he can't breathe and that his stomach is in knots. Pt denies SI but admits to wanting to harm others, anxiety and depression. Denies substance abuse.   HPI Elements:   Location:  Psychiatric. Quality:  Worsening. Severity:  Severe. Timing:  Constant. Duration:  New onset, persisting. Context:  Exacerbation of underlying concerns with anger management (pt reports 5 anger management courses).  Past Psychiatric History: Past Medical History  Diagnosis Date  . Hypertension   . Anxiety     reports that he quit smoking about 6 months ago. He does not have any smokeless tobacco history on file. He reports that he drinks alcohol. He reports that he uses illicit drugs (Marijuana). History reviewed. No pertinent family history.   Living Arrangements: Spouse/significant other;Parent   Abuse/Neglect [facility]) Physical Abuse: Denies Verbal Abuse: Denies Sexual Abuse: Denies Allergies:   Allergies  Allergen Reactions  . Latex Hives and Other (See Comments)    Headache    ACT Assessment Complete:  Yes:    Educational Status    Risk to Self: Risk to self with the past 6 months Is patient at risk for suicide?: No  Risk to Others:    Abuse: Abuse/Neglect Assessment (Assessment to be complete while patient is alone) Physical Abuse: Denies Verbal Abuse: Denies Sexual Abuse: Denies Exploitation of patient/patient's resources: Denies Self-Neglect: Denies  Prior Inpatient Therapy:  Prior Outpatient Therapy:    Additional Information:                    Objective: Blood pressure 108/71, pulse 59, temperature 98.3 F (36.8 C), temperature source Oral, resp. rate 18, height _0  (1.753 m), weight 80.74 kg (178 lb), SpO2 100.00%.Body mass index is 26.27 kg/(m^2). Results for orders placed during the hospital encounter of 04/15/14 (from the past 72 hour(s))  URINALYSIS, ROUTINE W REFLEX MICROSCOPIC     Status:  Abnormal   Collection Time    04/15/14 11:33 AM      Result Value Ref Range   Color, Urine AMBER (*) YELLOW   Comment: BIOCHEMICALS MAY BE AFFECTED BY COLOR   APPearance CLOUDY (*) CLEAR   Specific Gravity, Urine 1.029  1.005 - 1.030   pH 5.5  5.0 - 8.0   Glucose, UA NEGATIVE  NEGATIVE mg/dL   Hgb urine dipstick NEGATIVE  NEGATIVE   Bilirubin Urine NEGATIVE  NEGATIVE   Ketones, ur >80 (*) NEGATIVE mg/dL   Protein, ur NEGATIVE  NEGATIVE mg/dL   Urobilinogen, UA 0.2  0.0 - 1.0 mg/dL   Nitrite NEGATIVE  NEGATIVE   Leukocytes, UA SMALL (*) NEGATIVE  URINE MICROSCOPIC-ADD ON     Status: None   Collection Time    04/15/14 11:33 AM      Result Value Ref Range   Squamous Epithelial / LPF RARE  RARE   WBC, UA 7-10  <3 WBC/hpf   Bacteria, UA RARE  RARE   Urine-Other MUCOUS PRESENT    GC/CHLAMYDIA PROBE AMP     Status: Abnormal   Collection Time    04/15/14 12:42 PM      Result Value Ref Range   CT Probe RNA POSITIVE (*) NEGATIVE   Comment: (NOTE)     A Positive CT or NG Nucleic Acid Amplification Test (NAAT) result     should be considered presumptive evidence of infection.  The result     should be evaluated along with physical examination and other     diagnostic findings.   GC Probe RNA NEGATIVE  NEGATIVE   Comment: (NOTE)                                                                                               **Normal Reference Range: Negative**          Assay performed using the Gen-Probe APTIMA COMBO2 (R) Assay.     Acceptable specimen types for this assay include APTIMA Swabs (Unisex,     endocervical, urethral, or vaginal), first void urine, and ThinPrep     liquid based cytology samples.     Performed at [facility]     Status: None   Collection Time    04/15/14 12:42 PM      Result Value Ref Range   Alcohol, Ethyl (B) <11  0 - 11 mg/dL   Comment:            LOWEST DETECTABLE LIMIT FOR     SERUM ALCOHOL IS 11 mg/dL  FOR MEDICAL PURPOSES  ONLY  COMPREHENSIVE METABOLIC PANEL     Status: Abnormal   Collection Time    04/15/14 12:42 PM      Result Value Ref Range   Sodium 139  137 - 147 mEq/L   Potassium 4.3  3.7 - 5.3 mEq/L   Chloride 102  96 - 112 mEq/L   CO2 20  19 - 32 mEq/L   Glucose, Bld 108 (*) 70 - 99 mg/dL   BUN 10  6 - 23 mg/dL   Creatinine, Ser 1.12  0.50 - 1.35 mg/dL   Calcium 9.3  8.4 - 10.5 mg/dL   Total Protein 7.7  6.0 - 8.3 g/dL   Albumin 4.4  3.5 - 5.2 g/dL   AST 20  0 - 37 U/L   ALT 18  0 - 53 U/L   Alkaline Phosphatase 54  39 - 117 U/L   Total Bilirubin 0.5  0.3 - 1.2 mg/dL   GFR calc non Af Amer 89 (*) >90 mL/min   GFR calc Af Amer >90  >90 mL/min   Comment: (NOTE)     The eGFR has been calculated using the CKD EPI equation.     This calculation has not been validated in all clinical situations.     eGFR's persistently <90 mL/min signify possible Chronic Kidney     Disease.   Anion gap 17 (*) 5 - 15  CBC WITH DIFFERENTIAL     Status: Abnormal   Collection Time    04/15/14 12:42 PM      Result Value Ref Range   WBC 6.1  4.0 - 10.5 K/uL   RBC 5.54  4.22 - 5.81 MIL/uL   Hemoglobin 16.4  13.0 - 17.0 g/dL   HCT 49.6  39.0 - 52.0 %   MCV 89.5  78.0 - 100.0 fL   MCH 29.6  26.0 - 34.0 pg   MCHC 33.1  30.0 - 36.0 g/dL   RDW 12.5  11.5 - 15.5 %   Platelets 184  150 - 400 K/uL   Neutrophils Relative % 78 (*) 43 - 77 %   Neutro Abs 4.8  1.7 - 7.7 K/uL   Lymphocytes Relative 16  12 - 46 %   Lymphs Abs 1.0  0.7 - 4.0 K/uL   Monocytes Relative 6  3 - 12 %   Monocytes Absolute 0.3  0.1 - 1.0 K/uL   Eosinophils Relative 0  0 - 5 %   Eosinophils Absolute 0.0  0.0 - 0.7 K/uL   Basophils Relative 0  0 - 1 %   Basophils Absolute 0.0  0.0 - 0.1 K/uL  HIV ANTIBODY (ROUTINE TESTING)     Status: None   Collection Time    04/15/14 12:42 PM      Result Value Ref Range   HIV 1&2 Ab, 4th Generation NONREACTIVE  NONREACTIVE   Comment: (NOTE)     A NONREACTIVE HIV Ag/Ab result does not exclude HIV infection  since     the time frame for seroconversion is variable. If acute HIV infection     is suspected, a HIV-1 RNA Qualitative TMA test is recommended.     HIV-1/2 Antibody Diff         Not indicated.     HIV-1 RNA, Qual TMA           Not indicated.     PLEASE NOTE: This information has been disclosed to you from records  whose confidentiality may be protected by state law. If your state     requires such protection, then the state law prohibits you from making     any further disclosure of the information without the specific written     consent of the person to whom it pertains, or as otherwise permitted     by law. A general authorization for the release of medical or other     information is NOT sufficient for this purpose.     The performance of this assay has not been clinically validated in     patients less than 23 years old.     Performed at [place] (HOSP PERFORMED)     Status: Abnormal   Collection Time    04/15/14  1:33 PM      Result Value Ref Range   Opiates NONE DETECTED  NONE DETECTED   Cocaine POSITIVE (*) NONE DETECTED   Benzodiazepines NONE DETECTED  NONE DETECTED   Amphetamines NONE DETECTED  NONE DETECTED   Tetrahydrocannabinol POSITIVE (*) NONE DETECTED   Barbiturates NONE DETECTED  NONE DETECTED   Comment:            DRUG SCREEN FOR MEDICAL PURPOSES     ONLY.  IF CONFIRMATION IS NEEDED     FOR ANY PURPOSE, NOTIFY LAB     WITHIN 5 DAYS.                LOWEST DETECTABLE LIMITS     FOR URINE DRUG SCREEN     Drug Class       Cutoff (ng/mL)     Amphetamine      1000     Barbiturate      200     Benzodiazepine   366     Tricyclics       440     Opiates          300     Cocaine          300     THC              50   Labs are reviewed and are pertinent for N/A.  Current Facility-Administered Medications  Medication Dose Route Frequency Provider Last Rate Last Dose  . acetaminophen (TYLENOL) tablet 650 mg  650 mg Oral Q4H  PRN Benjamine Mola, FNP      . alum & mag hydroxide-simeth (MAALOX/MYLANTA) 200-200-20 MG/5ML suspension 30 mL  30 mL Oral PRN Benjamine Mola, FNP      . hydrOXYzine (ATARAX/VISTARIL) tablet 25 mg  25 mg Oral Q6H PRN Benjamine Mola, FNP      . ibuprofen (ADVIL,MOTRIN) tablet 600 mg  600 mg Oral Q8H PRN Benjamine Mola, FNP      . magnesium hydroxide (MILK OF MAGNESIA) suspension 30 mL  30 mL Oral Daily PRN Benjamine Mola, FNP      . nicotine (NICODERM CQ - dosed in mg/24 hours) patch 21 mg  21 mg Transdermal Daily John C Withrow, FNP      . ondansetron (ZOFRAN) tablet 4 mg  4 mg Oral Q8H PRN Benjamine Mola, FNP      . traZODone (DESYREL) tablet 50 mg  50 mg Oral QHS PRN Benjamine Mola, FNP        Psychiatric Specialty Exam:     Blood pressure 108/71, pulse 59, temperature 98.3 F (36.8 C), temperature source Oral,  resp. rate 18, height _0  (1.753 m), weight 80.74 kg (178 lb), SpO2 100.00%.Body mass index is 26.27 kg/(m^2).  General Appearance: Casual  Eye Contact::  Good  Speech:  Clear and Coherent  Volume:  Normal  Mood:  Euthymic  Affect:  Appropriate and Congruent  Thought Process:  Coherent and Goal Directed  Orientation:  Full (Time, Place, and Person)  Thought Content:  WDL  Suicidal Thoughts:  No  Homicidal Thoughts:  No  Memory:  Immediate;   Good Recent;   Good Remote;   Good  Judgement:  Fair  Insight:  Fair  Psychomotor Activity:  Normal  Concentration:  Good  Recall:  Good  Fund of Knowledge:Fair  Language: Good  Akathisia:  No  Handed:    AIMS (if indicated):     Assets:  Resilience Social Support  Sleep:      Musculoskeletal: Strength & Muscle Tone: within normal limits Gait & Station: normal Patient leans: N/A  Treatment Plan Summary: -Discharge home with outpatient referrals to counseling/psychiatry.   Benjamine Mola, FNP-BC 04/16/2014 11:14 AM  *Case reviewed with Dr. Sabra Heck.  Personally evaluated the patient and agree with assessment and  plan Geralyn Flash A. Sabra Heck, M.D.

## 2014-04-17 ENCOUNTER — Telehealth (HOSPITAL_BASED_OUTPATIENT_CLINIC_OR_DEPARTMENT_OTHER): Payer: Self-pay | Admitting: Emergency Medicine

## 2014-04-17 NOTE — Discharge Planning (Signed)
[facility] Community Liaison was unable to see patient, [facility] orange card information will be sent to the address listed. Buddy Duty, ext [phone]

## 2014-04-17 NOTE — Telephone Encounter (Signed)
Post ED Visit - Positive Culture Follow-up: Chart Hand-off to ED Flow Manager  Culture assessed and recommendations reviewed by:  Wes Dulaney, Pharm.D., BCPS  Celedonio Miyamoto, Pharm.D., BCPS  Georgina Pillion, Pharm.D., BCPS  [place], [address].D., BCPS, AAHIVP  Estella Husk, Pharm .D., BCPS, AAHIVP  Red Christians, Pharm.D.  Tennis Must, [place].D.  Positive chlamydia culture   Patient discharged without antimicrobial prescription and treatment is now indicated  Organism is resistant to prescribed ED discharge antimicrobial  Patient with positive blood cultures  Changes discussed with ED provider: sent to EDP 04/17/14 New antibiotic prescription    Berle Mull 04/17/2014, 10:04 AM

## 2014-04-20 ENCOUNTER — Telehealth (HOSPITAL_COMMUNITY): Payer: Self-pay

## 2014-04-21 ENCOUNTER — Telehealth (HOSPITAL_BASED_OUTPATIENT_CLINIC_OR_DEPARTMENT_OTHER): Payer: Self-pay | Admitting: Emergency Medicine
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
## 2014-04-21 NOTE — Progress Notes (Signed)
                  Dear __________[name]_________________:  Bonita Quin have been approved to have your discharge prescriptions filled through our [facility] (Medication Assistance Through [facility]) program. This program allows for a one-time (no refills) 34-day supply of selected medications for a low copay amount.  The copay is $3.00 per prescription. For instance, if you have one prescription, you will pay $3.00; for two prescriptions, you pay $6.00; for three prescriptions, you pay $9.00; and so on.  Only certain pharmacies are participating in this program with [facility]. You will need to select one of the pharmacies from the attached list and take your prescriptions, this letter, and your photo ID to one of the participating pharmacies.   We are excited that you are able to use the [facility] program to get your medications. These prescriptions must be filled within 7 days of hospital discharge or they will no longer be valid for the [facility] program. Should you have any problems with your prescriptions please contact your case management team member at [phone].  Thank you,   [employer] [facility]   Participating [facility] Pharmacies  [facility]   [facility] [address] [place], [place]   [facility] [address] [place], [place]   [facility] [address] [place], [place]   [facility]   [address], [place], [place]   [address] [place], [place], [place]   [address] [place], [place]   [address], [place], [place]   [address], [place], [place]   [address], [place], [place]   [address], [place], [place]   [address], [place], [place]   [address], [place], [place]   [address] [place], [place], [place]  [facility]   [address] [place], [place]   [address] [place],  [place]   [address] [place], [place], [place]   [address] [place], [place]   [address] [place], [place]   [address] [place] [address] [place], [place], [place] [facility]   [address] [place], [place]   [address] [employer], [place], [place]   [address] [place], [place]   [address], [place], [place]   [address], [place], [place]   [address], [place], [place]   [address] [place], [place]   [address], [place], [place]   [address], [place], [place]   [address], [place], [place]   [address], [place], [place]   [address], [place], [place]   [address], [place], [place]   [address] [place], [place], [place]  Independent Pharmacies   [facility] [address] [place], [place]   [facility] [address] [place], [place]   [facility] [address] [place], [place]   For continued medication needs, please contact the [facility] Department at  [phone].

## 2014-04-21 NOTE — Progress Notes (Signed)
Pt came to [facility] ED to get his MATCH letter to go to [facility] outpatient pharmacy. Reviewed directions to pharmacy. Encouraged pt again to obtain a self pay pcp  CM discussed and provided written information for self pay pcps, importance of pcp for f/u care, www.needymeds.org, discounted pharmacies and other [employer] such as [employer], [facility], affordable care act,  financial assistance, DSS and  health department  Reviewed resources for [employer] self pay pcps like Jovita Kussmaul, family medicine at [address], [facility] family practice, general medical clinics, [facility] urgent care plus others, medication resources, [facility] out patient pharmacies and housing Pt voiced understanding and appreciation of resources provided   Provided [facility] contact information

## 2014-05-01 ENCOUNTER — Emergency Department (HOSPITAL_COMMUNITY)
Admission: EM | Admit: 2014-05-01 | Discharge: 2014-05-01 | Disposition: A | Discharge status: Home / Self Care | Payer: Self-pay | Attending: Emergency Medicine | Admitting: Emergency Medicine

## 2014-05-01 ENCOUNTER — Encounter (HOSPITAL_COMMUNITY): Payer: Self-pay | Admitting: Emergency Medicine

## 2014-05-01 DIAGNOSIS — I10 Essential (primary) hypertension: Secondary | ICD-10-CM | POA: Insufficient documentation

## 2014-05-01 DIAGNOSIS — Z87891 Personal history of nicotine dependence: Secondary | ICD-10-CM | POA: Insufficient documentation

## 2014-05-01 DIAGNOSIS — H9209 Otalgia, unspecified ear: Secondary | ICD-10-CM | POA: Insufficient documentation

## 2014-05-01 DIAGNOSIS — Z91040 Latex allergy status: Secondary | ICD-10-CM | POA: Insufficient documentation

## 2014-05-01 DIAGNOSIS — H9202 Otalgia, left ear: Secondary | ICD-10-CM

## 2014-05-01 DIAGNOSIS — R51 Headache: Secondary | ICD-10-CM | POA: Insufficient documentation

## 2014-05-01 DIAGNOSIS — M542 Cervicalgia: Secondary | ICD-10-CM | POA: Insufficient documentation

## 2014-05-01 DIAGNOSIS — Z8659 Personal history of other mental and behavioral disorders: Secondary | ICD-10-CM | POA: Insufficient documentation

## 2014-05-01 MED ORDER — IBUPROFEN 800 MG PO TABS: 800.0000 mg | ORAL_TABLET | Freq: Three times a day (TID) | ORAL | Status: DC | PRN

## 2014-05-01 MED ORDER — ANTIPYRINE-BENZOCAINE 5.4-1.4 % OT SOLN: 3.0000 [drp] | OTIC | Status: DC | PRN

## 2014-05-01 MED ORDER — IBUPROFEN 400 MG PO TABS
800.0000 mg | ORAL_TABLET | Freq: Once | ORAL | Status: AC
  Administered 2014-05-01: 800 mg via ORAL
  Filled 2014-05-01: qty 2

## 2014-05-01 NOTE — Discharge Planning (Signed)
[facility] Community Liaison ° °Spoke to patient regarding primary care resources and establishing care with a provider. Resource guide and my contact information provided for any future questions or concerns. No other community liaison needs identified at this time. °

## 2014-05-01 NOTE — ED Provider Notes (Signed)
Medical screening examination/treatment/procedure(s) were performed by non-physician practitioner and as supervising physician I was immediately available for consultation/collaboration.    Vida Roller, MD 05/01/14 Windell Moment

## 2014-05-01 NOTE — Discharge Instructions (Signed)
Read the information below.  Use the prescribed medication as directed.  Please discuss all new medications with your pharmacist.  You may return to the Emergency Department at any time for worsening condition or any new symptoms that concern you.  If you develop fevers, uncontrolled pain, uncontrolled vomiting, or difficulty breathing or swallowing return to the ER for a recheck.     Otalgia The most common reason for this in children is an infection of the middle ear. Pain from the middle ear is usually caused by a build-up of fluid and pressure behind the eardrum. Pain from an earache can be sharp, dull, or burning. The pain may be temporary or constant. The middle ear is connected to the nasal passages by a short narrow tube called the Eustachian tube. The Eustachian tube allows fluid to drain out of the middle ear, and helps keep the pressure in your ear equalized. CAUSES  A cold or allergy can block the Eustachian tube with inflammation and the build-up of secretions. This is especially likely in small children, because their Eustachian tube is shorter and more horizontal. When the Eustachian tube closes, the normal flow of fluid from the middle ear is stopped. Fluid can accumulate and cause stuffiness, pain, hearing loss, and an ear infection if germs start growing in this area. SYMPTOMS  The symptoms of an ear infection may include fever, ear pain, fussiness, increased crying, and irritability. Many children will have temporary and minor hearing loss during and right after an ear infection. Permanent hearing loss is rare, but the risk increases the more infections a child has. Other causes of ear pain include retained water in the outer ear canal from swimming and bathing. Ear pain in adults is less likely to be from an ear infection. Ear pain may be referred from other locations. Referred pain may be from the joint between your jaw and the skull. It may also come from a tooth problem or problems in  the neck. Other causes of ear pain include:  A foreign body in the ear.  Outer ear infection.  Sinus infections.  Impacted ear wax.  Ear injury.  Arthritis of the jaw or TMJ problems.  Middle ear infection.  Tooth infections.  Sore throat with pain to the ears. DIAGNOSIS  Your caregiver can usually make the diagnosis by examining you. Sometimes other special studies, including x-rays and lab work may be necessary. TREATMENT   If antibiotics were prescribed, use them as directed and finish them even if you or your child's symptoms seem to be improved.  Sometimes PE tubes are needed in children. These are little plastic tubes which are put into the eardrum during a simple surgical procedure. They allow fluid to drain easier and allow the pressure in the middle ear to equalize. This helps relieve the ear pain caused by pressure changes. HOME CARE INSTRUCTIONS   Only take over-the-counter or prescription medicines for pain, discomfort, or fever as directed by your caregiver. DO NOT GIVE CHILDREN ASPIRIN because of the association of Reye's Syndrome in children taking aspirin.  Use a cold pack applied to the outer ear for 15-20 minutes, 03-04 times per day or as needed may reduce pain. Do not apply ice directly to the skin. You may cause frost bite.  Over-the-counter ear drops used as directed may be effective. Your caregiver may sometimes prescribe ear drops.  Resting in an upright position may help reduce pressure in the middle ear and relieve pain.  Ear pain caused  by rapidly descending from high altitudes can be relieved by swallowing or chewing gum. Allowing infants to suck on a bottle during airplane travel can help.  Do not smoke in the house or near children. If you are unable to quit smoking, smoke outside.  Control allergies. SEEK IMMEDIATE MEDICAL CARE IF:   You or your child are becoming sicker.  Pain or fever relief is not obtained with medicine.  You or your  child's symptoms (pain, fever, or irritability) do not improve within 24 to 48 hours or as instructed.  Severe pain suddenly stops hurting. This may indicate a ruptured eardrum.  You or your children develop new problems such as severe headaches, stiff neck, difficulty swallowing, or swelling of the face or around the ear. Document Released: 03/10/2004 Document Revised: 10/16/2011 Document Reviewed: 07/15/2008 [facility] Patient Information 2015 [place], [place]. This information is not intended to replace advice given to you by your health care provider. Make sure you discuss any questions you have with your health care provider.   Ear Drops You have been diagnosed with a condition requiring you to put drops of medicine into your outer ear. HOME CARE INSTRUCTIONS   Put drops in the affected ear as instructed. After putting the drops in, you will need to lie down with the affected ear facing up for ten minutes so the drops will remain in the ear canal and run down and fill the canal. Continue using the ear drops for as long as directed by your health care provider.  Prior to getting up, put a cotton ball gently in your ear canal. Leave enough of the cotton ball out so it can be easily removed. Do not attempt to push this down into the canal with a cotton-tipped swab or other instrument.  Do not irrigate or wash out your ears if you have had a perforated eardrum or mastoid surgery, or unless instructed to do so by your health care provider.  Keep appointments with your health care provider as instructed.  Finish all medicine, or use for the length of time prescribed by your health care provider. Continue the drops even if your problem seems to be doing well after a couple days, or continue as instructed. SEEK MEDICAL CARE IF:  You become worse or develop increasing pain.  You notice any unusual drainage from your ear (particularly if the drainage has a bad smell).  You develop hearing  difficulties.  You experience a serious form of dizziness in which you feel as if the room is spinning, and you feel nauseated (vertigo).  The outside of your ear becomes red or swollen or both. This may be a sign of an allergic reaction. MAKE SURE YOU:   Understand these instructions.  Will watch your condition.  Will get help right away if you are not doing well or get worse. Document Released: 07/18/2001 Document Revised: 07/29/2013 Document Reviewed: 02/18/2013 [facility] Patient Information 2015 [place], [place]. This information is not intended to replace advice given to you by your health care provider. Make sure you discuss any questions you have with your health care provider.
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
## 2014-05-01 NOTE — ED Notes (Signed)
Pt reporting left ear pain x 3 days causing headache and neck pain. Pt denies taking anything for pain or seeing a PCP.

## 2014-05-01 NOTE — ED Notes (Signed)
Pt reports having severe left ear pain. No acute distress noted.

## 2014-07-07 ENCOUNTER — Emergency Department (HOSPITAL_COMMUNITY)
Admission: EM | Admit: 2014-07-07 | Discharge: 2014-07-07 | Disposition: A | Discharge status: Home / Self Care | Payer: Self-pay | Attending: Emergency Medicine | Admitting: Emergency Medicine

## 2014-07-07 ENCOUNTER — Encounter (HOSPITAL_COMMUNITY): Payer: Self-pay | Admitting: *Deleted

## 2014-07-07 DIAGNOSIS — Z91040 Latex allergy status: Secondary | ICD-10-CM | POA: Insufficient documentation

## 2014-07-07 DIAGNOSIS — Z8659 Personal history of other mental and behavioral disorders: Secondary | ICD-10-CM | POA: Insufficient documentation

## 2014-07-07 DIAGNOSIS — J029 Acute pharyngitis, unspecified: Secondary | ICD-10-CM | POA: Insufficient documentation

## 2014-07-07 DIAGNOSIS — Z87891 Personal history of nicotine dependence: Secondary | ICD-10-CM | POA: Insufficient documentation

## 2014-07-07 DIAGNOSIS — I10 Essential (primary) hypertension: Secondary | ICD-10-CM | POA: Insufficient documentation

## 2014-07-07 LAB — RAPID STREP SCREEN (MED CTR MEBANE ONLY): Streptococcus, Group A Screen (Direct): NEGATIVE

## 2014-07-07 MED ORDER — IBUPROFEN 600 MG PO TABS: 600.0000 mg | ORAL_TABLET | Freq: Four times a day (QID) | ORAL | Status: DC | PRN

## 2014-07-07 MED ORDER — ACETAMINOPHEN 500 MG PO TABS: 500.0000 mg | ORAL_TABLET | Freq: Four times a day (QID) | ORAL | Status: DC | PRN

## 2014-07-07 MED ORDER — GUAIFENESIN-CODEINE 100-10 MG/5ML PO SOLN
10.0000 mL | Freq: Once | ORAL | Status: AC
  Administered 2014-07-07: 10 mL via ORAL
  Filled 2014-07-07: qty 10

## 2014-07-07 MED ORDER — GUAIFENESIN 100 MG/5ML PO LIQD: 100.0000 mg | ORAL | Status: DC | PRN

## 2014-07-07 MED ORDER — SALINE SPRAY 0.65 % NA SOLN
1.0000 | Freq: Once | NASAL | Status: AC
  Administered 2014-07-07: 1 via NASAL
  Filled 2014-07-07: qty 44

## 2014-07-07 NOTE — Discharge Instructions (Signed)
Please follow up with your primary care physician in 1-2 days. If you do not have one please call the [facility] listed above. Please take medications as prescribed. Please read all discharge instructions and return precautions.   Pharyngitis Pharyngitis is redness, pain, and swelling (inflammation) of your pharynx.  CAUSES  Pharyngitis is usually caused by infection. Most of the time, these infections are from viruses (viral) and are part of a cold. However, sometimes pharyngitis is caused by bacteria (bacterial). Pharyngitis can also be caused by allergies. Viral pharyngitis may be spread from person to person by coughing, sneezing, and personal items or utensils (cups, forks, spoons, toothbrushes). Bacterial pharyngitis may be spread from person to person by more intimate contact, such as kissing.  SIGNS AND SYMPTOMS  Symptoms of pharyngitis include:   Sore throat.   Tiredness (fatigue).   Low-grade fever.   Headache.  Joint pain and muscle aches.  Skin rashes.  Swollen lymph nodes.  Plaque-like film on throat or tonsils (often seen with bacterial pharyngitis). DIAGNOSIS  Your health care provider will ask you questions about your illness and your symptoms. Your medical history, along with a physical exam, is often all that is needed to diagnose pharyngitis. Sometimes, a rapid strep test is done. Other lab tests may also be done, depending on the suspected cause.  TREATMENT  Viral pharyngitis will usually get better in 3-4 days without the use of medicine. Bacterial pharyngitis is treated with medicines that kill germs (antibiotics).  HOME CARE INSTRUCTIONS   Drink enough water and fluids to keep your urine clear or pale yellow.   Only take over-the-counter or prescription medicines as directed by your health care provider:   If you are prescribed antibiotics, make sure you finish them even if you start to feel better.   Do not take aspirin.    Get lots of rest.   Gargle with 8 oz of salt water ( tsp of salt per 1 qt of water) as often as every 1-2 hours to soothe your throat.   Throat lozenges (if you are not at risk for choking) or sprays may be used to soothe your throat. SEEK MEDICAL CARE IF:   You have large, tender lumps in your neck.  You have a rash.  You cough up green, yellow-brown, or bloody spit. SEEK IMMEDIATE MEDICAL CARE IF:   Your neck becomes stiff.  You drool or are unable to swallow liquids.  You vomit or are unable to keep medicines or liquids down.  You have severe pain that does not go away with the use of recommended medicines.  You have trouble breathing (not caused by a stuffy nose). MAKE SURE YOU:   Understand these instructions.  Will watch your condition.  Will get help right away if you are not doing well or get worse. Document Released: 07/24/2005 Document Revised: 05/14/2013 Document Reviewed: 03/31/2013 [facility] Patient Information 2015 [place]. This information is not intended to replace advice given to you by your health care provider. Make sure you discuss any questions you have with your health care provider.

## 2014-07-07 NOTE — ED Provider Notes (Signed)
CSN: [id number]     Arrival date & time 07/07/14  1757 History  This chart was scribed for non-physician practitioner, Francee PiccoloJennifer Sherwood Castilla, PA-C working with Gerhard Munchobert Lockwood, MD by Greggory StallionKayla Andersen, ED scribe. This patient was seen in room TR09C/TR09C and the patient's care was started at 6:32 PM.   Chief Complaint  Patient presents with  . Sore Throat  . Nasal Congestion   The history is provided by the patient. No language interpreter was used.    HPI Comments: Allen Henson is a 28 y.o. male who presents to the Emergency Department complaining of cough, congestion and sore throat that started a few days ago. States he has had one episode of post tussive emesis. 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 Pt has used mucinex nose strips with no relief. Denies fever, chills.   Past Medical History  Diagnosis Date  . Hypertension   . Anxiety    Past Surgical History  Procedure Laterality Date  . No past surgeries     History reviewed. No pertinent family history. History  Substance Use Topics  . Smoking status: Former Smoker    Quit date: 10/15/2013  . Smokeless tobacco: Not on file  . Alcohol Use: Yes     Comment: every weekend    Review of Systems  Constitutional: Negative for fever and chills.  HENT: Positive for congestion and sore throat.   All other systems reviewed and are negative.  Allergies  Latex  Home Medications   Prior to Admission medications   Medication Sig Start Date End Date Taking? Authorizing Provider  acetaminophen (TYLENOL) 500 MG tablet Take 1 tablet (500 mg total) by mouth every 6 (six) hours as needed. 07/07/14   Jidenna Figgs L Pennelope Basque, PA-C  antipyrine-benzocaine (AURALGAN) otic solution Place 3 drops into the left ear every 2 (two) hours as needed for ear pain. 05/01/14   Trixie DredgeEmily West, PA-C  guaiFENesin (ROBITUSSIN) 100 MG/5ML liquid Take 5-10 mLs (100-200 mg total) by mouth every 4 (four)  hours as needed for cough. 07/07/14   Cord Wilczynski L Edell Mesenbrink, PA-C  ibuprofen (ADVIL,MOTRIN) 600 MG tablet Take 1 tablet (600 mg total) by mouth every 6 (six) hours as needed. 07/07/14   Munirah Doerner L Clayton Bosserman, PA-C  ibuprofen (ADVIL,MOTRIN) 800 MG tablet Take 1 tablet (800 mg total) by mouth every 8 (eight) hours as needed for mild pain or moderate pain. 05/01/14   Trixie DredgeEmily West, PA-C  PRESCRIPTION MEDICATION Take 1 tablet by mouth 2 (two) times daily. antibiotic    Historical Provider, MD   BP 135/92 mmHg  Pulse 68  Temp(Src) 98.6 F (37 C) (Oral)  Resp 18  SpO2 98%   Physical Exam  Constitutional: He is oriented to person, place, and time. He appears well-developed and well-nourished. No distress.  HENT:  Head: Normocephalic and atraumatic.  Right Ear: External ear normal.  Left Ear: External ear normal.  Nose: Rhinorrhea present.  Mouth/Throat: Uvula is midline. No trismus in the jaw. No uvula swelling. Posterior oropharyngeal erythema present. No oropharyngeal exudate.  Eyes: Conjunctivae are normal.  Neck: Normal range of motion. Neck supple.  Cardiovascular: Normal rate.   Pulmonary/Chest: Effort normal.  Abdominal: Soft.  Musculoskeletal: Normal range of motion.  Lymphadenopathy:    He has cervical adenopathy.  Neurological: He is alert and oriented to person, place, and time.  Skin: Skin is warm and dry. He is not diaphoretic.  Psychiatric: He has  a normal mood and affect.  Nursing note and vitals reviewed.   ED Course  Procedures (including critical care time)  DIAGNOSTIC STUDIES: Oxygen Saturation is 100% on RA, normal by my interpretation.    COORDINATION OF CARE: 6:34 PM-Discussed treatment plan which includes rapid strep test and symptomatic treatment with pt at bedside and pt agreed to plan.   Labs Review Labs Reviewed  RAPID STREP SCREEN  CULTURE, GROUP A STREP    Imaging Review No results found.   EKG Interpretation None      MDM   Final  diagnoses:  Viral pharyngitis    Filed Vitals:   07/07/14 1939  BP: 135/92  Pulse: 68  Temp: 98.6 F (37 C)  Resp: 18   Afebrile, NAD, non-toxic appearing, AAOx4.  Pt afebrile without tonsillar exudate, negative strep. Presents with mild cervical lymphadenopathy, & dysphagia; diagnosis of viral pharyngitis. No abx indicated. DC w symptomatic tx for pain  Pt does not appear dehydrated, but did discuss importance of water rehydration. Presentation non concerning for PTA or infxn spread to soft tissue. No trismus or uvula deviation. Specific return precautions discussed. Pt able to drink water in ED without difficulty with intact air way. Recommended PCP follow up.   I personally performed the services described in this documentation, which was scribed in my presence. The recorded information has been reviewed and is accurate.  Jeannetta EllisJennifer L Saisha Hogue, PA-C 07/07/14 2017  Gerhard Munchobert Lockwood, MD 07/08/14 Aretha [id number]

## 2014-07-07 NOTE — ED Notes (Signed)
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 No fevers, chills. mucinex strip on his nose.

## 2014-07-09 LAB — CULTURE, GROUP A STREP

## 2014-09-12 ENCOUNTER — Encounter (HOSPITAL_COMMUNITY): Payer: Self-pay | Admitting: Physical Medicine and Rehabilitation

## 2014-09-12 ENCOUNTER — Emergency Department (INDEPENDENT_AMBULATORY_CARE_PROVIDER_SITE_OTHER)
Admission: EM | Admit: 2014-09-12 | Discharge: 2014-09-12 | Disposition: A | Discharge status: Nursing Home (Includes ICF) | Payer: Self-pay | Source: Home / Self Care | Attending: Family Medicine | Admitting: Family Medicine

## 2014-09-12 ENCOUNTER — Encounter (HOSPITAL_COMMUNITY): Payer: Self-pay | Admitting: Emergency Medicine

## 2014-09-12 ENCOUNTER — Emergency Department (HOSPITAL_COMMUNITY)
Admission: EM | Admit: 2014-09-12 | Discharge: 2014-09-12 | Disposition: A | Discharge status: Home / Self Care | Payer: Self-pay | Attending: Emergency Medicine | Admitting: Emergency Medicine

## 2014-09-12 ENCOUNTER — Emergency Department (HOSPITAL_COMMUNITY): Payer: Self-pay

## 2014-09-12 DIAGNOSIS — M546 Pain in thoracic spine: Secondary | ICD-10-CM | POA: Insufficient documentation

## 2014-09-12 DIAGNOSIS — M549 Dorsalgia, unspecified: Secondary | ICD-10-CM

## 2014-09-12 DIAGNOSIS — R200 Anesthesia of skin: Secondary | ICD-10-CM

## 2014-09-12 DIAGNOSIS — R05 Cough: Secondary | ICD-10-CM | POA: Insufficient documentation

## 2014-09-12 DIAGNOSIS — Z792 Long term (current) use of antibiotics: Secondary | ICD-10-CM | POA: Insufficient documentation

## 2014-09-12 DIAGNOSIS — I10 Essential (primary) hypertension: Secondary | ICD-10-CM | POA: Insufficient documentation

## 2014-09-12 DIAGNOSIS — R208 Other disturbances of skin sensation: Secondary | ICD-10-CM

## 2014-09-12 DIAGNOSIS — R42 Dizziness and giddiness: Secondary | ICD-10-CM

## 2014-09-12 DIAGNOSIS — Z87891 Personal history of nicotine dependence: Secondary | ICD-10-CM | POA: Insufficient documentation

## 2014-09-12 DIAGNOSIS — Z91040 Latex allergy status: Secondary | ICD-10-CM | POA: Insufficient documentation

## 2014-09-12 DIAGNOSIS — Z8659 Personal history of other mental and behavioral disorders: Secondary | ICD-10-CM | POA: Insufficient documentation

## 2014-09-12 DIAGNOSIS — R0789 Other chest pain: Secondary | ICD-10-CM

## 2014-09-12 LAB — D-DIMER, QUANTITATIVE: D-Dimer, Quant: <0.27 ug/mL-FEU (ref 0.00–0.48)

## 2014-09-12 MED ORDER — ALBUTEROL SULFATE HFA 108 (90 BASE) MCG/ACT IN AERS
2.0000 | INHALATION_SPRAY | Freq: Once | RESPIRATORY_TRACT | Status: AC
  Administered 2014-09-12: 2 via RESPIRATORY_TRACT
  Filled 2014-09-12: qty 6.7

## 2014-09-12 MED ORDER — IBUPROFEN 400 MG PO TABS
400.0000 mg | ORAL_TABLET | Freq: Once | ORAL | Status: AC
  Administered 2014-09-12: 400 mg via ORAL

## 2014-09-12 MED ORDER — TRAMADOL HCL 50 MG PO TABS: 50.0000 mg | ORAL_TABLET | Freq: Four times a day (QID) | ORAL | Status: DC | PRN

## 2014-09-12 NOTE — ED Provider Notes (Signed)
CSN: [id number]     Arrival date & time 09/12/14  1339 History   First MD Initiated Contact with Patient 09/12/14 1504     Chief Complaint  Patient presents with  . Back Pain     (Consider location/radiation/quality/duration/timing/severity/associated sxs/prior Treatment) Patient is a 29 y.o. male presenting with back pain. The history is provided by the patient.  Back Pain Associated symptoms: no abdominal pain, no chest pain, no fever and no headaches   pt c/o pleuritic left upper back pain for the past few weeks. Constant. Dull. Moderate. Non radiating. Denies trauma, fall or injury. No radicular pain. No sob. States feels pain limits his ability to take full/deep breath on occasion. No anterior pain or any chest pain. Symptoms are not related to activity or exertion. Occasional non prod cough. No sore throat or other uri c/o. No fever or chills. No sob, nv or diaphoresis. No numbness/weakness. No leg pain or swelling. No prolonged immobility, recent surgery, trauma, or travel. No dvt or pe hx. Non smoker.       Past Medical History  Diagnosis Date  . Hypertension   . Anxiety    Past Surgical History  Procedure Laterality Date  . No past surgeries     History reviewed. No pertinent family history. History  Substance Use Topics  . Smoking status: Former Smoker    Quit date: 10/15/2013  . Smokeless tobacco: Not on file  . Alcohol Use: Yes     Comment: every weekend    Review of Systems  Constitutional: Negative for fever and chills.  HENT: Negative for sore throat.   Eyes: Negative for redness.  Respiratory: Positive for cough. Negative for shortness of breath.   Cardiovascular: Negative for chest pain and leg swelling.  Gastrointestinal: Negative for vomiting, abdominal pain and diarrhea.  Genitourinary: Negative for flank pain.  Musculoskeletal: Positive for back pain. Negative for neck pain.  Skin: Negative for rash.  Neurological: Negative for headaches.   Hematological: Does not bruise/bleed easily.  Psychiatric/Behavioral: Negative for confusion.      Allergies  Latex  Home Medications   Prior to Admission medications   Medication Sig Start Date End Date Taking? Authorizing Provider  acetaminophen (TYLENOL) 500 MG tablet Take 1 tablet (500 mg total) by mouth every 6 (six) hours as needed. 07/07/14   Jennifer L Piepenbrink, PA-C  antipyrine-benzocaine (AURALGAN) otic solution Place 3 drops into the left ear every 2 (two) hours as needed for ear pain. 05/01/14   Trixie DredgeEmily West, PA-C  guaiFENesin (ROBITUSSIN) 100 MG/5ML liquid Take 5-10 mLs (100-200 mg total) by mouth every 4 (four) hours as needed for cough. 07/07/14   Jennifer L Piepenbrink, PA-C  ibuprofen (ADVIL,MOTRIN) 600 MG tablet Take 1 tablet (600 mg total) by mouth every 6 (six) hours as needed. 07/07/14   Jennifer L Piepenbrink, PA-C  ibuprofen (ADVIL,MOTRIN) 800 MG tablet Take 1 tablet (800 mg total) by mouth every 8 (eight) hours as needed for mild pain or moderate pain. 05/01/14   Trixie DredgeEmily West, PA-C  PRESCRIPTION MEDICATION Take 1 tablet by mouth 2 (two) times daily. antibiotic    Historical Provider, MD   BP 116/70 mmHg  Pulse 72  Temp(Src) 97.8 F (36.6 C) (Oral)  Resp 18  SpO2 99% Physical Exam  Constitutional: He is oriented to person, place, and time. He appears well-developed and well-nourished. No distress.  HENT:  Mouth/Throat: Oropharynx is clear and moist.  Eyes: Conjunctivae are normal.  Neck: Neck supple. No tracheal deviation present.  Cardiovascular: Normal rate, regular rhythm, normal heart sounds and intact distal pulses.  Exam reveals no gallop and no friction rub.   No murmur heard. Pulmonary/Chest: Effort normal and breath sounds normal. No accessory muscle usage. No respiratory distress. He exhibits no tenderness.  Abdominal: Soft. He exhibits no distension. There is no tenderness.  Genitourinary:  No cva tenderness.   Musculoskeletal: Normal range of  motion.  tls spine non tender.  Left mid to upper back musculoskeletal tenderness along inferior border of right scapula.  No sts. No skin changes.   Neurological: He is alert and oriented to person, place, and time.  Skin: Skin is warm and dry. No rash noted. He is not diaphoretic.  Psychiatric: He has a normal mood and affect.  Nursing note and vitals reviewed.   ED Course  Procedures (including critical care time) Labs Review  Dg Chest 2 View  09/12/2014   CLINICAL DATA:  Intermittent diffuse chest pain and left-sided upper back pain for 1 month. Symptoms worsened since left night. Cough and congestion.  EXAM: CHEST  2 VIEW  COMPARISON:  06/16/2012.  FINDINGS: Trachea is midline. Heart size normal. Lungs are clear. No pleural fluid.  IMPRESSION: No acute findings.   Electronically Signed   By: Leanna Battles M.D.   On: 09/12/2014 16:03       MDM   Cxr. Labs.  Motrin po.  Reviewed nursing notes and prior charts for additional history.   1645 ddimer pending - signed out to Dr Gwendolyn Grant, if d dimer neg, d/c to home, if pos ct.    Suzi Roots, MD 09/12/14 [phone]

## 2014-09-12 NOTE — ED Notes (Signed)
Reports intermittent left back pain that started a month ago.  Initially intermittent, no known injury.  This episode of pain started last night and has not gone away.  Patient reports he was able to sleep about 4 hours, but pain present when he woke.  Pain is sharp, left upper back/shoulder blade, taking deep breaths makes pain worse.  Intermittently feels hear racing.  Patient not sob, but feels pain keeps him from taking a deep breath

## 2014-09-12 NOTE — ED Provider Notes (Signed)
1700 - Care from Dr. Denton LankSteinl. 72M here with back pain, awaiting D-dimer. D-dimer negative. Stable for discharge.  Elwin MochaBlair Colleen Donahoe, MD 09/12/14 [phone]

## 2014-09-12 NOTE — ED Notes (Addendum)
[facility] sided [facility]. Ongoing x1 month. States pain is preventing him from taking deep breaths. Respirations unlabored. Pt is alert and oriented x4.

## 2014-09-12 NOTE — Discharge Instructions (Signed)
It was our pleasure to provide your ER care today - we hope that you feel better.  Take motrin or aleve as need for pain.  You may also take ultram as need for pain - no driving when taking.  You may try heat therapy, and/or gentle massage and stretching to area.  Follow up with primary care doctor in 1 week if symptoms fail to improve/resolve.  Return to ER if worse, new symptoms, fevers, trouble breathing, other concern.     Back Pain, Adult Low back pain is very common. About 1 in 5 people have back pain.The cause of low back pain is rarely dangerous. The pain often gets better over time.About half of people with a sudden onset of back pain feel better in just 2 weeks. About 8 in 10 people feel better by 6 weeks.  CAUSES Some common causes of back pain include:  Strain of the muscles or ligaments supporting the spine.  Wear and tear (degeneration) of the spinal discs.  Arthritis.  Direct injury to the back. DIAGNOSIS Most of the time, the direct cause of low back pain is not known.However, back pain can be treated effectively even when the exact cause of the pain is unknown.Answering your caregiver's questions about your overall health and symptoms is one of the most accurate ways to make sure the cause of your pain is not dangerous. If your caregiver needs more information, he or she may order lab work or imaging tests (X-rays or MRIs).However, even if imaging tests show changes in your back, this usually does not require surgery. HOME CARE INSTRUCTIONS For many people, back pain returns.Since low back pain is rarely dangerous, it is often a condition that people can learn to [facility]on their own.   Remain active. It is stressful on the back to sit or stand in one place. Do not sit, drive, or stand in one place for more than 30 minutes at a time. Take short walks on level surfaces as soon as pain allows.Try to increase the length of time you walk each day.  Do not stay in  bed.Resting more than 1 or 2 days can delay your recovery.  Do not avoid exercise or work.Your body is made to move.It is not dangerous to be active, even though your back may hurt.Your back will likely heal faster if you return to being active before your pain is gone.  Pay attention to your body when you bend and lift. Many people have less discomfortwhen lifting if they bend their knees, keep the load close to their bodies,and avoid twisting. Often, the most comfortable positions are those that put less stress on your recovering back.  Find a comfortable position to sleep. Use a firm mattress and lie on your side with your knees slightly bent. If you lie on your back, put a pillow under your knees.  Only take over-the-counter or prescription medicines as directed by your caregiver. Over-the-counter medicines to reduce pain and inflammation are often the most helpful.Your caregiver may prescribe muscle relaxant drugs.These medicines help dull your pain so you can more quickly return to your normal activities and healthy exercise.  Put ice on the injured area.  Put ice in a plastic bag.  Place a towel between your skin and the bag.  Leave the ice on for 15-20 minutes, 03-04 times a day for the first 2 to 3 days. After that, ice and heat may be alternated to reduce pain and spasms.  Ask your caregiver about  trying back exercises and gentle massage. This may be of some benefit.  Avoid feeling anxious or stressed.Stress increases muscle tension and can worsen back pain.It is important to recognize when you are anxious or stressed and learn ways to manage it.Exercise is a great option. SEEK MEDICAL CARE IF:  You have pain that is not relieved with rest or medicine.  You have pain that does not improve in 1 week.  You have new symptoms.  You are generally not feeling well. SEEK IMMEDIATE MEDICAL CARE IF:   You have pain that radiates from your back into your legs.  You  develop new bowel or bladder control problems.  You have unusual weakness or numbness in your arms or legs.  You develop nausea or vomiting.  You develop abdominal pain.  You feel faint. Document Released: 07/24/2005 Document Revised: 01/23/2012 Document Reviewed: 11/25/2013 [facility] Patient Information 2015 [place], [place]. This information is not intended to replace advice given to you by your health care provider. Make sure you discuss any questions you have with your health care provider.     Heat Therapy Heat therapy can help ease sore, stiff, injured, and tight muscles and joints. Heat relaxes your muscles, which may help ease your pain.  RISKS AND COMPLICATIONS If you have any of the following conditions, do not use heat therapy unless your health care provider has approved:  Poor circulation.  Healing wounds or scarred skin in the area being treated.  Diabetes, heart disease, or high blood pressure.  Not being able to feel (numbness) the area being treated.  Unusual swelling of the area being treated.  Active infections.  Blood clots.  Cancer.  Inability to communicate pain. This may include young children and people who have problems with their brain function (dementia).  Pregnancy. Heat therapy should only be used on old, pre-existing, or long-lasting (chronic) injuries. Do not use heat therapy on new injuries unless directed by your health care provider. HOW TO USE HEAT THERAPY There are several different kinds of heat therapy, including:  Moist heat pack.  Warm water bath.  Hot water bottle.  Electric heating pad.  Heated gel pack.  Heated wrap.  Electric heating pad. Use the heat therapy method suggested by your health care provider. Follow your health care provider's instructions on when and how to use heat therapy. GENERAL HEAT THERAPY RECOMMENDATIONS  Do not sleep while using heat therapy. Only use heat therapy while you are awake.  Your  skin may turn pink while using heat therapy. Do not use heat therapy if your skin turns red.  Do not use heat therapy if you have new pain.  High heat or long exposure to heat can cause burns. Be careful when using heat therapy to avoid burning your skin.  Do not use heat therapy on areas of your skin that are already irritated, such as with a rash or sunburn. SEEK MEDICAL CARE IF:  You have blisters, redness, swelling, or numbness.  You have new pain.  Your pain is worse. MAKE SURE YOU:  Understand these instructions.  Will watch your condition.  Will get help right away if you are not doing well or get worse. Document Released: 10/16/2011 Document Revised: 12/08/2013 Document Reviewed: 09/16/2013 [facility] Patient Information 2015 [place], [place]. This information is not intended to replace advice given to you by your health care provider. Make sure you discuss any questions you have with your health care provider.    Thoracic Strain You have injured the  muscles or tendons that attach to the upper part of your back behind your chest. This injury is called a thoracic strain, thoracic sprain, or mid-back strain.  CAUSES  The cause of thoracic strain varies. A less severe injury involves pulling a muscle or tendon without tearing it. A more severe injury involves tearing (rupturing) a muscle or tendon. With less severe injuries, there may be little loss of strength. Sometimes, there are breaks (fractures) in the bones to which the muscles are attached. These fractures are rare, unless there was a direct hit (trauma) or you have weak bones due to osteoporosis or age. Longstanding strains may be caused by overuse or improper form during certain movements. Obesity can also increase your risk for back injuries. Sudden strains may occur due to injury or not warming up properly before exercise. Often, there is no obvious cause for a thoracic strain. SYMPTOMS  The main symptom is pain,  especially with movement, such as during exercise. DIAGNOSIS  Your caregiver can usually tell what is wrong by taking an X-ray and doing a physical exam. TREATMENT   Physical therapy may be helpful for recovery. Your caregiver can give you exercises to do or refer you to a physical therapist after your pain improves.  After your pain improves, strengthening and conditioning programs appropriate for your sport or occupation may be helpful.  Always warm up before physical activities or athletics. Stretching after physical activity may also help.  Certain over-the-counter medicines may also help. Ask your caregiver if there are medicines that would help you. If this is your first thoracic strain injury, proper care and proper healing time before starting activities should prevent long-term problems. Torn ligaments and tendons require as long to heal as broken bones. Average healing times may be only 1 week for a mild strain. For torn muscles and tendons, healing time may be up to 6 weeks to 2 months. HOME CARE INSTRUCTIONS   Apply ice to the injured area. Ice massages may also be used as directed.  Put ice in a plastic bag.  Place a towel between your skin and the bag.  Leave the ice on for 15-20 minutes, 03-04 times a day, for the first 2 days.  Only take over-the-counter or prescription medicines for pain, discomfort, or fever as directed by your caregiver.  Keep your appointments for physical therapy if this was prescribed.  Use wraps and back braces as instructed. SEEK IMMEDIATE MEDICAL CARE IF:   You have an increase in bruising, swelling, or pain.  Your pain has not improved with medicines.  You develop new shortness of breath, chest pain, or fever.  Problems seem to be getting worse rather than better. MAKE SURE YOU:   Understand these instructions.  Will watch your condition.  Will get help right away if you are not doing well or get worse. Document Released:  10/14/2003 Document Revised: 10/16/2011 Document Reviewed: 09/09/2010 [facility] Patient Information 2015 [place], [place]. This information is not intended to replace advice given to you by your health care provider. Make sure you discuss any questions you have with your health care provider.

## 2014-09-12 NOTE — ED Provider Notes (Signed)
CSN: [id number]     Arrival date & time 09/12/14  1236 History   First MD Initiated Contact with Patient 09/12/14 1252     Chief Complaint  Patient presents with  . Back Pain   (Consider location/radiation/quality/duration/timing/severity/associated sxs/prior Treatment) HPI          29 year old male with no past medical history presents complaining of back pain. He describes left upper back pain that radiates around to the left side of his chest. It happens occasionally and is worse with exertion. It started one month ago got much worse yesterday. It has been constant since yesterday. It is exacerbated by deep breathing makes him feel short of breath. He also has palpitations. The pain sometimes radiates up into the left side of his neck. When the pain gets worse she also experiences numbness and tingling in his left arm. He has never had this before. He is currently symptomatic.  Past Medical History  Diagnosis Date  . Hypertension   . Anxiety    Past Surgical History  Procedure Laterality Date  . No past surgeries     No family history on file. History  Substance Use Topics  . Smoking status: Former Smoker    Quit date: 10/15/2013  . Smokeless tobacco: Not on file  . Alcohol Use: Yes     Comment: every weekend    Review of Systems  Respiratory: Positive for cough and shortness of breath.   Cardiovascular: Positive for chest pain.  Gastrointestinal: Positive for diarrhea. Negative for nausea and vomiting.  Musculoskeletal: Positive for back pain.  Neurological: Positive for dizziness, light-headedness and numbness.  All other systems reviewed and are negative.   Allergies  Latex  Home Medications   Prior to Admission medications   Medication Sig Start Date End Date Taking? Authorizing Provider  acetaminophen (TYLENOL) 500 MG tablet Take 1 tablet (500 mg total) by mouth every 6 (six) hours as needed. 07/07/14   Jennifer L Piepenbrink, PA-C  antipyrine-benzocaine (AURALGAN)  otic solution Place 3 drops into the left ear every 2 (two) hours as needed for ear pain. 05/01/14   Trixie DredgeEmily West, PA-C  guaiFENesin (ROBITUSSIN) 100 MG/5ML liquid Take 5-10 mLs (100-200 mg total) by mouth every 4 (four) hours as needed for cough. 07/07/14   Jennifer L Piepenbrink, PA-C  ibuprofen (ADVIL,MOTRIN) 600 MG tablet Take 1 tablet (600 mg total) by mouth every 6 (six) hours as needed. 07/07/14   Jennifer L Piepenbrink, PA-C  ibuprofen (ADVIL,MOTRIN) 800 MG tablet Take 1 tablet (800 mg total) by mouth every 8 (eight) hours as needed for mild pain or moderate pain. 05/01/14   Trixie DredgeEmily West, PA-C  PRESCRIPTION MEDICATION Take 1 tablet by mouth 2 (two) times daily. antibiotic    Historical Provider, MD   BP 127/77 mmHg  Pulse 65  Temp(Src) 98.1 F (36.7 C) (Oral)  Resp 16  SpO2 100% Physical Exam  Constitutional: He is oriented to person, place, and time. He appears well-developed and well-nourished. No distress.  HENT:  Head: Normocephalic.  Cardiovascular: Normal rate, regular rhythm and normal heart sounds.   Pulmonary/Chest: Effort normal and breath sounds normal. No respiratory distress.  Musculoskeletal:       Thoracic back: Normal.  Neurological: He is alert and oriented to person, place, and time. He has normal strength. No sensory deficit. Coordination normal.  Skin: Skin is warm and dry. No rash noted. He is not diaphoretic.  Psychiatric: He has a normal mood and affect. Judgment normal.  Nursing note  and vitals reviewed.   ED Course  Procedures (including critical care time) Labs Review Labs Reviewed - No data to display  Imaging Review No results found.   MDM   1. Upper back pain   2. Other chest pain   3. Arm numbness   4. Lightheaded    Discussed case with attending, patient will be transferred to the emergency department for evaluation of chest pain    Graylon Good, PA-C 09/12/14 1320

## 2014-09-16 ENCOUNTER — Emergency Department (HOSPITAL_COMMUNITY)
Admission: EM | Admit: 2014-09-16 | Discharge: 2014-09-16 | Disposition: A | Discharge status: Home / Self Care | Payer: Self-pay | Attending: Emergency Medicine | Admitting: Emergency Medicine

## 2014-09-16 ENCOUNTER — Encounter (HOSPITAL_COMMUNITY): Payer: Self-pay | Admitting: Emergency Medicine

## 2014-09-16 ENCOUNTER — Emergency Department (HOSPITAL_COMMUNITY): Payer: Self-pay

## 2014-09-16 DIAGNOSIS — F419 Anxiety disorder, unspecified: Secondary | ICD-10-CM | POA: Insufficient documentation

## 2014-09-16 DIAGNOSIS — R079 Chest pain, unspecified: Secondary | ICD-10-CM | POA: Insufficient documentation

## 2014-09-16 DIAGNOSIS — Z91040 Latex allergy status: Secondary | ICD-10-CM | POA: Insufficient documentation

## 2014-09-16 DIAGNOSIS — I10 Essential (primary) hypertension: Secondary | ICD-10-CM | POA: Insufficient documentation

## 2014-09-16 DIAGNOSIS — Z87891 Personal history of nicotine dependence: Secondary | ICD-10-CM | POA: Insufficient documentation

## 2014-09-16 LAB — BASIC METABOLIC PANEL
ANION GAP: 7 (ref 5–15)
BUN: 8 mg/dL (ref 6–23)
CALCIUM: 9.5 mg/dL (ref 8.4–10.5)
CO2: 27 mmol/L (ref 19–32)
Chloride: 104 mmol/L (ref 96–112)
Creatinine, Ser: 1.22 mg/dL (ref 0.50–1.35)
GFR, EST NON AFRICAN AMERICAN: 80 mL/min — AB (ref >90)
Glucose, Bld: 107 mg/dL — ABNORMAL HIGH (ref 70–99)
Potassium: 3.5 mmol/L (ref 3.5–5.1)
SODIUM: 138 mmol/L (ref 135–145)

## 2014-09-16 LAB — CBC
HEMATOCRIT: 49.4 % (ref 39.0–52.0)
Hemoglobin: 16.6 g/dL (ref 13.0–17.0)
MCH: 30.1 pg (ref 26.0–34.0)
MCHC: 33.6 g/dL (ref 30.0–36.0)
MCV: 89.7 fL (ref 78.0–100.0)
Platelets: 212 10*3/uL (ref 150–400)
RBC: 5.51 MIL/uL (ref 4.22–5.81)
RDW: 12.7 % (ref 11.5–15.5)
WBC: 4.4 10*3/uL (ref 4.0–10.5)

## 2014-09-16 LAB — I-STAT TROPONIN, ED: Troponin i, poc: 0.01 ng/mL (ref 0.00–0.08)

## 2014-09-16 MED ORDER — LORAZEPAM 1 MG PO TABS: 1.0000 mg | ORAL_TABLET | Freq: Three times a day (TID) | ORAL | Status: DC | PRN

## 2014-09-16 NOTE — ED Notes (Signed)
Patient transported to x-ray. ?

## 2014-09-16 NOTE — Discharge Instructions (Signed)
[facility] as soon as possible to get a counselor to see for stress management and further treatment, as needed.    Panic Attacks Panic attacks are sudden, short feelings of great fear or discomfort. You may have them for no reason when you are relaxed, when you are uneasy (anxious), or when you are sleeping.  HOME CARE  Take all your medicines as told.  Check with your doctor before starting new medicines.  Keep all doctor visits. GET HELP IF:  You are not able to take your medicines as told.  Your symptoms do not get better.  Your symptoms get worse. GET HELP RIGHT AWAY IF:  Your attacks seem different than your normal attacks.  You have thoughts about hurting yourself or others.  You take panic attack medicine and you have a side effect. MAKE SURE YOU:  Understand these instructions.  Will watch your condition.  Will get help right away if you are not doing well or get worse. Document Released: 08/26/2010 Document Revised: 05/14/2013 Document Reviewed: 03/07/2013 [facility] Patient Information 2015 [place]. This information is not intended to replace advice given to you by your health care provider. Make sure you discuss any questions you have with your health care provider.

## 2014-09-16 NOTE — ED Provider Notes (Signed)
CSN: [id number]     Arrival date & time 09/16/14  1806 History   First MD Initiated Contact with Patient 09/16/14 1843     Chief Complaint  Patient presents with  . Shortness of Breath     (Consider location/radiation/quality/duration/timing/severity/associated sxs/prior Treatment) HPI   Allen Henson is a 29 y.o. male who is here for evaluation of left anterior chest pain which started today.  He was seen at an urgent care and sent here for evaluation of same.  He was in the ED 4 days ago with similar problem.  He relates the pain started one month ago and occurs intermittently, when he is feeling stressed and thinking about not being able to be with his family.  He is currently not living with his children and their mother, but living with his mother, because of problems in his former life.  He denies fever, chills, nausea, vomiting, weakness or dizziness.  His mother is trying to get him to go to therapy.  He has not previously had this problem.  There are no other known modifying factors  Past Medical History  Diagnosis Date  . Hypertension   . Anxiety    Past Surgical History  Procedure Laterality Date  . No past surgeries     No family history on file. History  Substance Use Topics  . Smoking status: Former Smoker    Quit date: 10/15/2013  . Smokeless tobacco: Not on file  . Alcohol Use: Yes     Comment: every weekend    Review of Systems  All other systems reviewed and are negative.     Allergies  Latex  Home Medications   Prior to Admission medications   Medication Sig Start Date End Date Taking? Authorizing Provider  LORazepam (ATIVAN) 1 MG tablet Take 1 tablet (1 mg total) by mouth 3 (three) times daily as needed for anxiety. 09/16/14   Flint MelterElliott L Orton Capell, MD   BP 122/75 mmHg  Pulse 60  Temp(Src) 98.6 F (37 C) (Oral)  Resp 12  SpO2 98% Physical Exam  Constitutional: He is oriented to person, place, and time. He appears well-developed and well-nourished.   HENT:  Head: Normocephalic and atraumatic.  Right Ear: External ear normal.  Left Ear: External ear normal.  Eyes: Conjunctivae and EOM are normal. Pupils are equal, round, and reactive to light.  Neck: Normal range of motion and phonation normal. Neck supple.  Cardiovascular: Normal rate.   Pulmonary/Chest: Effort normal. He exhibits no bony tenderness.  Musculoskeletal: Normal range of motion.  Neurological: He is alert and oriented to person, place, and time. No cranial nerve deficit or sensory deficit. He exhibits normal muscle tone. Coordination normal.  Skin: Skin is warm, dry and intact.  Psychiatric: He has a normal mood and affect. His behavior is normal. Judgment and thought content normal.  Nursing note and vitals reviewed.   ED Course  Procedures (including critical care time)  Findings discussed with patient and his mother who is in the room with him.  Patient agrees that anxiety is likely source of his discomfort.  He is committed to improving things, and [facility].  Labs Review Labs Reviewed  BASIC METABOLIC PANEL - Abnormal; Notable for the following:    Glucose, Bld 107 (*)    GFR calc non Af Amer 80 (*)    All other components within normal limits  CBC  I-STAT TROPOININ, ED    Imaging Review Dg Chest 2 View  09/16/2014   CLINICAL DATA:  Progressive chest pain.  EXAM: CHEST  2 VIEW  COMPARISON:  09/12/2014  FINDINGS: The heart size and mediastinal contours are within normal limits. Both lungs are clear. The visualized skeletal structures are unremarkable.  IMPRESSION: Normal chest, unchanged.   Electronically Signed   By: Francene Boyers M.D.   On: 09/16/2014 19:04     EKG Interpretation   Date/Time:  Wednesday September 16 2014 18:16:25 EST Ventricular Rate:  82 PR Interval:  182 QRS Duration: 100 QT Interval:  362 QTC Calculation: 422 R Axis:   96 Text Interpretation:  Normal sinus rhythm with sinus arrhythmia Rightward  axis  Nonspecific T wave abnormality Abnormal ECG since last tracing no  significant change Confirmed by Effie Shy  MD, Chelbie Jarnagin ([id number]) on 09/16/2014  9:37:51 PM      MDM   Final diagnoses:  Anxiety    Chest pain related to anxiety.  Significant psychosocial stressors exist.  Doubt ACS, PE, pneumonia, or metabolic instability.   Nursing Notes Reviewed/ Care Coordinated Applicable Imaging Reviewed Interpretation of Laboratory Data incorporated into ED treatment  The patient appears reasonably screened and/or stabilized for discharge and I doubt any other medical condition or other [facility] requiring further screening, evaluation, or treatment in the ED at this time prior to discharge.  Plan: Home Medications- Ativan #15; Home Treatments- rest; return here if the recommended treatment, does not improve the symptoms; Recommended follow up- Counseling at [facility] asap  Flint Melter, MD 09/16/14 2138

## 2014-09-16 NOTE — ED Notes (Signed)
Pt reports difficulty taking full breath and when he does he has sharp chest pain. Pt skin warm and dry.

## 2014-09-16 NOTE — ED Notes (Signed)
Patient transported to X-ray 

## 2015-02-22 ENCOUNTER — Emergency Department (HOSPITAL_COMMUNITY)
Admission: EM | Admit: 2015-02-22 | Discharge: 2015-02-22 | Disposition: A | Discharge status: Home / Self Care | Payer: Self-pay | Attending: Emergency Medicine | Admitting: Emergency Medicine

## 2015-02-22 ENCOUNTER — Encounter (HOSPITAL_COMMUNITY): Payer: Self-pay | Admitting: *Deleted

## 2015-02-22 ENCOUNTER — Emergency Department (HOSPITAL_COMMUNITY): Payer: Self-pay

## 2015-02-22 DIAGNOSIS — Z91040 Latex allergy status: Secondary | ICD-10-CM | POA: Insufficient documentation

## 2015-02-22 DIAGNOSIS — R0602 Shortness of breath: Secondary | ICD-10-CM | POA: Insufficient documentation

## 2015-02-22 DIAGNOSIS — F419 Anxiety disorder, unspecified: Secondary | ICD-10-CM | POA: Insufficient documentation

## 2015-02-22 DIAGNOSIS — M546 Pain in thoracic spine: Secondary | ICD-10-CM | POA: Insufficient documentation

## 2015-02-22 DIAGNOSIS — Z720 Tobacco use: Secondary | ICD-10-CM | POA: Insufficient documentation

## 2015-02-22 DIAGNOSIS — I10 Essential (primary) hypertension: Secondary | ICD-10-CM | POA: Insufficient documentation

## 2015-02-22 LAB — BASIC METABOLIC PANEL
Anion gap: 9 (ref 5–15)
BUN: 9 mg/dL (ref 6–20)
CO2: 23 mmol/L (ref 22–32)
Calcium: 9.2 mg/dL (ref 8.9–10.3)
Chloride: 103 mmol/L (ref 101–111)
Creatinine, Ser: 1.35 mg/dL — ABNORMAL HIGH (ref 0.61–1.24)
GFR calc Af Amer: >60 mL/min (ref >60)
Glucose, Bld: 107 mg/dL — ABNORMAL HIGH (ref 65–99)
Potassium: 3.8 mmol/L (ref 3.5–5.1)
SODIUM: 135 mmol/L (ref 135–145)

## 2015-02-22 LAB — CBC
HCT: 45.7 % (ref 39.0–52.0)
Hemoglobin: 15.2 g/dL (ref 13.0–17.0)
MCH: 29.5 pg (ref 26.0–34.0)
MCHC: 33.3 g/dL (ref 30.0–36.0)
MCV: 88.7 fL (ref 78.0–100.0)
PLATELETS: 200 10*3/uL (ref 150–400)
RBC: 5.15 MIL/uL (ref 4.22–5.81)
RDW: 12.3 % (ref 11.5–15.5)
WBC: 4 10*3/uL (ref 4.0–10.5)

## 2015-02-22 LAB — I-STAT TROPONIN, ED: Troponin i, poc: 0 ng/mL (ref 0.00–0.08)

## 2015-02-22 MED ORDER — IBUPROFEN 400 MG PO TABS
400.0000 mg | ORAL_TABLET | Freq: Once | ORAL | Status: AC
  Administered 2015-02-22: 400 mg via ORAL
  Filled 2015-02-22: qty 1

## 2015-02-22 NOTE — Discharge Instructions (Signed)
It was our pleasure to provide your ER care today - we hope that you feel better.  Take motrin or aleve as need for pain.   Try heat therapy, and/or gentle massage to sore area upper back.  Follow up with primary care doctor in 1 week if symptoms fail to improve/resolve.  Return to ER if worse, new symptoms, fevers, recurrent or persistent chest pain, trouble breathing, other concern.    Smoking Hazards Smoking cigarettes is extremely bad for your health. Tobacco smoke has over 200 known poisons in it. It contains the poisonous gases nitrogen oxide and carbon monoxide. There are over 60 chemicals in tobacco smoke that cause cancer. Some of the chemicals found in cigarette smoke include:   Cyanide.   Benzene.   Formaldehyde.   Methanol (wood alcohol).   Acetylene (fuel used in welding torches).   Ammonia.  Even smoking lightly shortens your life expectancy by several years. You can greatly reduce the risk of medical problems for you and your family by stopping now. Smoking is the most preventable cause of death and disease in our society. Within days of quitting smoking, your circulation improves, you decrease the risk of having a heart attack, and your lung capacity improves. There may be some increased phlegm in the first few days after quitting, and it may take months for your lungs to clear up completely. Quitting for 10 years reduces your risk of developing lung cancer to almost that of a nonsmoker.  WHAT ARE THE RISKS OF SMOKING? Cigarette smokers have an increased risk of many serious medical problems, including:  Lung cancer.   Lung disease (such as pneumonia, bronchitis, and emphysema).   Heart attack and chest pain due to the heart not getting enough oxygen (angina).   Heart disease and peripheral blood vessel disease.   Hypertension.   Stroke.   Oral cancer (cancer of the lip, mouth, or voice box).   Bladder cancer.   Pancreatic cancer.    Cervical cancer.   Pregnancy complications, including premature birth.   Stillbirths and smaller newborn babies, birth defects, and genetic damage to sperm.   Early menopause.   Lower estrogen level for women.   Infertility.   Facial wrinkles.   Blindness.   Increased risk of broken bones (fractures).   Senile dementia.   Stomach ulcers and internal bleeding.   Delayed wound healing and increased risk of complications during surgery. Because of secondhand smoke exposure, children of smokers have an increased risk of the following:   Sudden infant death syndrome (SIDS).   Respiratory infections.   Lung cancer.   Heart disease.   Ear infections.  WHY IS SMOKING ADDICTIVE? Nicotine is the chemical agent in tobacco that is capable of causing addiction or dependence. When you smoke and inhale, nicotine is absorbed rapidly into the bloodstream through your lungs. Both inhaled and noninhaled nicotine may be addictive.  WHAT ARE THE BENEFITS OF QUITTING?  There are many health benefits to quitting smoking. Some are:   The likelihood of developing cancer and heart disease decreases. Health improvements are seen almost immediately.   Blood pressure, pulse rate, and breathing patterns start returning to normal soon after quitting.   People who quit may see an improvement in their overall quality of life.  HOW DO YOU QUIT SMOKING? Smoking is an addiction with both physical and psychological effects, and longtime habits can be hard to change. Your health care provider can recommend:  Programs and community resources, which may include  group support, education, or therapy.  Replacement products, such as patches, gum, and nasal sprays. Use these products only as directed. Do not replace cigarette smoking with electronic cigarettes (commonly called e-cigarettes). The safety of e-cigarettes is unknown, and some may contain harmful chemicals. FOR MORE  INFORMATION  American Lung Association: www.lung.org  American Cancer Society: www.cancer.org Document Released: 08/31/2004 Document Revised: 05/14/2013 Document Reviewed: 01/13/2013 [facility] Patient Information 2015 [place], [place]. This information is not intended to replace advice given to you by your health care provider. Make sure you discuss any questions you have with your health care provider.    Heat Therapy Heat therapy can help ease sore, stiff, injured, and tight muscles and joints. Heat relaxes your muscles, which may help ease your pain.  RISKS AND COMPLICATIONS If you have any of the following conditions, do not use heat therapy unless your health care provider has approved:  Poor circulation.  Healing wounds or scarred skin in the area being treated.  Diabetes, heart disease, or high blood pressure.  Not being able to feel (numbness) the area being treated.  Unusual swelling of the area being treated.  Active infections.  Blood clots.  Cancer.  Inability to communicate pain. This may include young children and people who have problems with their brain function (dementia).  Pregnancy. Heat therapy should only be used on old, pre-existing, or long-lasting (chronic) injuries. Do not use heat therapy on new injuries unless directed by your health care provider. HOW TO USE HEAT THERAPY There are several different kinds of heat therapy, including:  Moist heat pack.  Warm water bath.  Hot water bottle.  Electric heating pad.  Heated gel pack.  Heated wrap.  Electric heating pad. Use the heat therapy method suggested by your health care provider. Follow your health care provider's instructions on when and how to use heat therapy. GENERAL HEAT THERAPY RECOMMENDATIONS  Do not sleep while using heat therapy. Only use heat therapy while you are awake.  Your skin may turn pink while using heat therapy. Do not use heat therapy if your skin turns red.  Do  not use heat therapy if you have new pain.  High heat or long exposure to heat can cause burns. Be careful when using heat therapy to avoid burning your skin.  Do not use heat therapy on areas of your skin that are already irritated, such as with a rash or sunburn. SEEK MEDICAL CARE IF:  You have blisters, redness, swelling, or numbness.  You have new pain.  Your pain is worse. MAKE SURE YOU:  Understand these instructions.  Will watch your condition.  Will get help right away if you are not doing well or get worse. Document Released: 10/16/2011 Document Revised: 12/08/2013 Document Reviewed: 09/16/2013 [facility] Patient Information 2015 [place], [place]. This information is not intended to replace advice given to you by your health care provider. Make sure you discuss any questions you have with your health care provider.   Thoracic Strain Thoracic strain is an injury to the muscles of the upper back. A mild strain may take only 1 week to heal. Torn muscles or tendons may take 6 weeks to 2 months to heal. HOME CARE  Put ice on the injured area.  Put ice in a plastic bag.  Place a towel between your skin and the bag.  Leave the ice on for 15-20 minutes, 03-04 times a day, for the first 2 days.  Only take medicine as told by your doctor.  Go to physical therapy and perform exercises as told by your doctor.  Use wraps and back braces as told by your doctor.  Warm up before being active. GET HELP RIGHT AWAY IF:   There is more bruising, puffiness (swelling), or pain.  Medicine does not help the pain.  You have trouble breathing, chest pain, or a fever.  Your problems seem to be getting worse, not better. MAKE SURE YOU:   Understand these instructions.  Will watch your condition.  Will get help right away if you are not doing well or get worse. Document Released: 01/10/2008 Document Revised: 10/16/2011 Document Reviewed: 09/12/2010 [facility] Patient Information  2015 [place]. This information is not intended to replace advice given to you by your health care provider. Make sure you discuss any questions you have with your health care provider.

## 2015-02-22 NOTE — ED Notes (Signed)
Pt states that he began having SOB and chest pain approx 30 minutes ago while smoking a cigarette. NAD noted

## 2015-02-22 NOTE — ED Provider Notes (Signed)
CSN: [id number]     Arrival date & time 02/22/15  1737 History   First MD Initiated Contact with Patient 02/22/15 1828     Chief Complaint  Patient presents with  . Shortness of Breath  . Chest Pain     (Consider location/radiation/quality/duration/timing/severity/associated sxs/prior Treatment) Patient is a 29 y.o. male presenting with shortness of breath and chest pain. The history is provided by the patient.  Shortness of Breath Associated symptoms: chest pain   Associated symptoms: no cough, no fever, no headaches, no neck pain, no rash, no sore throat and no vomiting   Chest Pain Associated symptoms: back pain and shortness of breath   Associated symptoms: no cough, no fever, no headache and not vomiting   Patient indicates shortly pta, when smoking cigarette, had onset pain right upper back, that with certain movements/positional changes shoots around back towards chest. w movement, pain sharp, transient, lasting seconds. No pleuritic cp or sob. No cough or uri c/o. No fever or chills. Denies back injury or strain. No numbness/weakness. Denies hx asthma or lung dis. No hx heart dis. No cocaine use. Denies hx dvt or pe. No leg pain or swelling. No recent immobility, surgery, travel, or trauma.      Past Medical History  Diagnosis Date  . Hypertension   . Anxiety    Past Surgical History  Procedure Laterality Date  . No past surgeries     No family history on file. History  Substance Use Topics  . Smoking status: Current Every Day Smoker -- 1.00 packs/day    Types: Cigarettes    Last Attempt to Quit: 10/15/2013  . Smokeless tobacco: Not on file  . Alcohol Use: Yes     Comment: every weekend    Review of Systems  Constitutional: Negative for fever.  HENT: Negative for sore throat.   Respiratory: Positive for shortness of breath. Negative for cough.   Cardiovascular: Positive for chest pain. Negative for leg swelling.  Gastrointestinal: Negative for vomiting.   Genitourinary: Negative for flank pain.  Musculoskeletal: Positive for back pain. Negative for neck pain.  Skin: Negative for rash.  Neurological: Negative for headaches.  Hematological: Negative for adenopathy.  Psychiatric/Behavioral: The patient is nervous/anxious.       Allergies  Latex  Home Medications   Prior to Admission medications   Medication Sig Start Date End Date Taking? Authorizing Provider  LORazepam (ATIVAN) 1 MG tablet Take 1 tablet (1 mg total) by mouth 3 (three) times daily as needed for anxiety. 09/16/14   Mancel Bale, MD   BP 117/81 mmHg  Pulse 63  Temp(Src) 98.3 F (36.8 C) (Oral)  Resp 18  SpO2 100% Physical Exam  Constitutional: He appears well-developed and well-nourished. No distress.  HENT:  Mouth/Throat: Oropharynx is clear and moist.  Eyes: Conjunctivae are normal. No scleral icterus.  Neck: Neck supple. No tracheal deviation present.  Cardiovascular: Normal rate, regular rhythm, normal heart sounds and intact distal pulses.  Exam reveals no gallop and no friction rub.   No murmur heard. Pulmonary/Chest: Effort normal and breath sounds normal. No accessory muscle usage. No respiratory distress. He exhibits no tenderness.  Abdominal: He exhibits no distension.  Musculoskeletal: Normal range of motion.  CT spine non tender. Pain/tenderness adjacent to right scapula, reproducing symptoms. No sts or skin changes.   Neurological: He is alert.  Steady gait.   Skin: Skin is warm and dry. No rash noted. He is not diaphoretic.  Psychiatric: He has a normal mood  and affect.  Nursing note and vitals reviewed.   ED Course  Procedures (including critical care time) Labs Review   Results for orders placed or performed during the hospital encounter of 02/22/15  I-stat troponin, ED  Result Value Ref Range   Troponin i, poc 0.00 0.00 - 0.08 ng/mL   Comment 3           Dg Chest 2 View  02/22/2015   CLINICAL DATA:  Acute onset chest pain and  shortness of breath approximately 1 hour ago.  EXAM: CHEST  2 VIEW  COMPARISON:  09/16/2014  FINDINGS: The heart size and mediastinal contours are within normal limits. Both lungs are clear. The visualized skeletal structures are unremarkable.  IMPRESSION: Negative.  No active cardiopulmonary disease.   Electronically Signed   By: Myles RosenthalJohn  Stahl M.D.   On: 02/22/2015 18:26      EKG Interpretation   Date/Time:  Monday February 22 2015 17:41:04 EDT Ventricular Rate:  63 PR Interval:  192 QRS Duration: 90 QT Interval:  368 QTC Calculation: 376 R Axis:   82 Text Interpretation:  Normal sinus rhythm Normal ECG No significant change  since last tracing Confirmed by Denton LankSTEINL  MD, Caryn BeeKEVIN ([id number]) on 02/22/2015  6:31:57 PM      MDM   Cxr.  Motrin po.  Labs, cxr done prior triage.   cxr neg.   Pt appears stable for d/c.      Cathren LaineKevin Artisha Capri, MD 02/22/15 [phone]

## 2015-03-04 ENCOUNTER — Encounter (HOSPITAL_COMMUNITY): Payer: Self-pay

## 2015-03-04 ENCOUNTER — Emergency Department (HOSPITAL_COMMUNITY): Payer: Self-pay

## 2015-03-04 ENCOUNTER — Emergency Department (HOSPITAL_COMMUNITY)
Admission: EM | Admit: 2015-03-04 | Discharge: 2015-03-04 | Disposition: A | Discharge status: Home / Self Care | Payer: Self-pay | Attending: Emergency Medicine | Admitting: Emergency Medicine

## 2015-03-04 DIAGNOSIS — I10 Essential (primary) hypertension: Secondary | ICD-10-CM | POA: Insufficient documentation

## 2015-03-04 DIAGNOSIS — Z720 Tobacco use: Secondary | ICD-10-CM | POA: Insufficient documentation

## 2015-03-04 DIAGNOSIS — F419 Anxiety disorder, unspecified: Secondary | ICD-10-CM | POA: Insufficient documentation

## 2015-03-04 DIAGNOSIS — R0602 Shortness of breath: Secondary | ICD-10-CM | POA: Insufficient documentation

## 2015-03-04 DIAGNOSIS — Z91040 Latex allergy status: Secondary | ICD-10-CM | POA: Insufficient documentation

## 2015-03-04 LAB — I-STAT CHEM 8, ED
BUN: 11 mg/dL (ref 6–20)
CALCIUM ION: 1.18 mmol/L (ref 1.12–1.23)
CHLORIDE: 104 mmol/L (ref 101–111)
CREATININE: 1.3 mg/dL — AB (ref 0.61–1.24)
Glucose, Bld: 96 mg/dL (ref 65–99)
HEMATOCRIT: 48 % (ref 39.0–52.0)
Hemoglobin: 16.3 g/dL (ref 13.0–17.0)
Potassium: 3.6 mmol/L (ref 3.5–5.1)
SODIUM: 139 mmol/L (ref 135–145)
TCO2: 22 mmol/L (ref 0–100)

## 2015-03-04 LAB — I-STAT TROPONIN, ED: Troponin i, poc: 0.01 ng/mL (ref 0.00–0.08)

## 2015-03-04 MED ORDER — IBUPROFEN 800 MG PO TABS
800.0000 mg | ORAL_TABLET | Freq: Once | ORAL | Status: AC
  Administered 2015-03-04: 800 mg via ORAL
  Filled 2015-03-04: qty 1

## 2015-03-04 MED ORDER — SODIUM CHLORIDE 0.9 % IV BOLUS (SEPSIS)
1000.0000 mL | Freq: Once | INTRAVENOUS | Status: AC
  Administered 2015-03-04: 1000 mL via INTRAVENOUS

## 2015-03-04 MED ORDER — LORAZEPAM 2 MG/ML IJ SOLN
2.0000 mg | Freq: Once | INTRAMUSCULAR | Status: AC
  Administered 2015-03-04: 2 mg via INTRAVENOUS
  Filled 2015-03-04: qty 1

## 2015-03-04 NOTE — Discharge Instructions (Signed)
°Emergency Department Resource Guide °1) Find a Doctor and Pay Out of Pocket °Although you won't have to find out who is covered by your insurance plan, it is a good idea to ask around and get recommendations. You will then need to call the office and see if the doctor you have chosen will accept you as a new patient and what types of options they offer for patients who are self-pay. Some doctors offer discounts or will set up payment plans for their patients who do not have insurance, but you will need to ask so you aren't surprised when you get to your appointment. ° °2) Contact Your Local Health Department °Not all health departments have doctors that can see patients for sick visits, but many do, so it is worth a call to see if yours does. If you don't know where your local health department is, you can check in your phone book. The CDC also has a tool to help you locate your state's health department, and many state websites also have listings of all of their local health departments. ° °3) Find a Walk-in Clinic °If your illness is not likely to be very severe or complicated, you may want to try a walk in clinic. These are popping up all over the country in pharmacies, drugstores, and shopping centers. They're usually staffed by nurse practitioners or physician assistants that have been trained to treat common illnesses and complaints. They're usually fairly quick and inexpensive. However, if you have serious medical issues or chronic medical problems, these are probably not your best option. ° °No Primary Care Doctor: °- Call [facility] at  832-8000 - they can help you locate a primary care doctor that  accepts your insurance, provides certain services, etc. °- Physician Referral Service- 1-800-533-3463 ° °Chronic Pain Problems: °Organization         Address  Phone   Notes  °[facility]  (336) 297-2271 Patients need to be referred by their primary care doctor.  ° °Medication  Assistance: °Organization         Address  Phone   Notes  °[place] [facility] [address] °[place], [place] [address] (336) 641-8030 --Must be a resident of [place] °-- Must have NO insurance coverage whatsoever (no Medicaid/ Medicare, etc.) °-- The pt. MUST have a primary care doctor that directs their care regularly and follows them in the community °  °[facility]  (866) 331-1348   °[employer]  (888) 892-1162   ° °Agencies that provide inexpensive medical care: °Organization         Address  Phone   Notes  °[facility]  (336) 832-8035   °[facility]    (336) 832-7272   °[facility] [address] °[place], [place] [address] (336) 832-4777   °[facility] [address], °[place] (336) 271-4999   °[facility]    (336) 373-0678   °[facility]    (336) 272-1050   °[facility] ° [address], [place] Phone:  (336) 832-4444, Fax:  (336) 832-4440 Hours of Operation:  9 am - 6 pm, M-F.  Also accepts Medicaid/Medicare and self-pay.  °[facility] ° [address], [place] Phone: (336) 832-3150, Fax: (336) 832-3151. Hours of Operation:  8:30 am - 5:30 pm, M-F.  Also accepts Medicaid and self-pay.  °[facility] [place] [address], [place] Phone: (336) 878-6027   °[facility] [address], [place], [place] (336)723-1848, Ext. 123 Mondays & Thursdays: 7-9 AM.  First 15 patients are seen on a first come, first serve basis. °  ° °Medicaid-accepting [place] Providers: ° °Organization         Address  Phone   Notes  °[facility] [address], [place] (336) 641-2100 Also accepts self-pay patients.  °[facility] [address], [place] ° (336) 856-9996   °[facility] [address], [place]  (336) 288-8857   °[facility] [address] [place] [address], [place] (336) 299-7000   °Veita Bland [address], [place]  ° (336) 373-1557 Only accepts [place] Access Medicaid patients after they have their name applied to their card.  ° °Self-Pay (no insurance) in [place]: ° °Organization         Address  Phone   Notes  °Sickle Cell Patients, [facility] [address], [place] (336) 832-1970   °[facility] [address], [place] (336) 832-4400   °[facility] ° [address],  (336) 992-4800   °[facility]/Dr. Osei-Bonsu ° [address] [place] [address], [place] or [address], [place] (336) 841-8500 Phone number for both [place] and [place] locations is the same.  °[facility] [address], [place] (336) 299-0000   °[facility] [address] [place] [address], [place] or [address] (336) 852-7530 °(336) 878-2260   °[facility] [address], [place] (336) 350-1642, phone; (336) 294-5005, fax Sees patients 1st and 3rd Saturday of every month.  Must not qualify for public or private insurance (i.e. Medicaid, Medicare, [place] Health Choice, Veterans' Benefits) • Household income should be no more than 200% of the poverty level •The clinic cannot treat you if you are pregnant or think you are pregnant • Sexually transmitted diseases are not treated at the clinic.  ° ° °Dental Care: °Organization         Address  Phone  Notes  °[place] [facility] [address],  (336) 641-6152 Accepts children up to age 21 who are enrolled in Medicaid or [place] Health Choice; pregnant women with a Medicaid card; and children who have applied for Medicaid or [place] Health Choice, but were declined, whose parents can pay a reduced fee at time of service.  °[place]  [facility] [place]  [address], [place] (336) 641-7733 Accepts children up to age 21 who are enrolled in Medicaid or [place] Health Choice; pregnant women with a Medicaid card; and children who have applied for Medicaid or [place] Health Choice, but were declined, whose parents can pay a reduced fee at time of service.  °[facility] ° [address],  (336) 641-4533 Patients are seen by appointment only. Walk-ins are not accepted. [facility] will see patients 18 years of age and older. °Monday - Tuesday (8am-5pm) °Most Wednesdays (8:30-5pm) °$30 per visit, cash only  °[facility] ° [address], [place] (336) 641-4533 Patients are seen by appointment only. Walk-ins are not accepted. [facility] will see patients 18 years of age and older. °One   Wednesday Evening (Monthly: Volunteer Based).  $30 per visit, cash only  °[facility]  (919) 537-3737 for adults; Children under age 4, call Graduate Pediatric Dentistry at (919) [phone]. Children aged 4-14, please call (919) [phone] to request a pediatric application. ° Dental services are provided in all areas of dental care including fillings, crowns and bridges, complete and partial dentures, implants, gum treatment, root canals, and extractions. Preventive care is also provided. Treatment is provided to both adults and children. °Patients are selected via a lottery and there is often a waiting list. °  °[facility] [address], °[place] ° (336) 763-8833 [email or web address] °  °[facility] [address], [place], [place] (336)723-1848, Ext. 123 Second and Fourth Thursday of each month, opens at 6:30 AM; Clinic ends at 9 AM.  Patients are seen on a first-come first-served basis, and a limited number are seen during each clinic.  ° °[facility] ° [address], [place], [place] (336) 723-7904    Eligibility Requirements °You must have lived in [place], [place], or [place] counties for at least the last three months. °  You cannot be eligible for state or federal sponsored healthcare insurance, including Veterans Administration, Medicaid, or Medicare. °  You generally cannot be eligible for healthcare insurance through your employer.  °  How to apply: °Eligibility screenings are held every Tuesday and Wednesday afternoon from 1:00 pm until 4:00 pm. You do not need an appointment for the interview!  °[facility] [address], [place], [place] 336-631-2330   °[facility]  336-342-8273   °[facility]  336-703-3100   °[facility]  336-570-6415   ° °Behavioral Health Resources in the Community: °Intensive Outpatient Programs °Organization         Address  Phone  Notes  °[facility] [address], [place], [place] 336-878-6098   °[facility] [address], [place], [place] 336-832-9800   °[facility] [address], [place], [place] ° 336-882-2125   °[facility] [address],  °[place], [place] 1-800-853-5163 or 336-641-4981   °Substance Abuse Resources °Organization         Address  Phone  Notes  °[facility]  336-882-2125   °[facility]  336-784-9470   °[facility]  336-285-9073   °[facility]  336-845-3988   °Residential & Outpatient Substance Abuse Program  1-800-659-3381   °Psychological Services °Organization         Address  Phone  Notes  °[facility]  336- 832-9600   °[facility]  336- 378-7881   °[facility] [address], [place] 1-800-853-5163 or 336-641-4981   ° °Mobile Crisis Teams °Organization         Address  Phone  Notes  °[facility], Mobile Crisis Care Unit  1-877-626-1772   °Assertive °[facility] ° [address]  [place], [place] 336-834-9664   °Sharon DeEsch [address] °[place] 336-554-5454   ° °Self-Help/Support Groups °Organization         Address  Phone             Notes  °[facility] - variety of support groups  336- 373-1402 Call for more information  °Narcotics Anonymous (NA), [facility] [address], °[place]   2 meetings at this location  ° °  Residential Treatment Programs °Organization         Address  Phone  Notes  °[facility] [address],    °[place]  1-866-801-8205   °[facility] ° [address], [place], [place] 704-293-8524   °[facility] [address], [place] 336-845-3988 Admissions: 8am-3pm M-F  °[facility] [address]    °[place], [place] 336-841-1104   °[facility] [address], [place], [place] 336-379-7146   °[facility] [address]  °[place], [place] 336-285-9073   °[facility] - Intensive Outpatient [address], [place], [place] 336-852-3033   °[facility] [address]  °[place], [place] 1-877-615-2722 or 336-784-9470   °[facility] [address] [place], [place] 336-227-7417 Accepts Medicaid  °[facility] [address]  °[place] 1-800-659-3381 Substance Abuse/Addiction Treatment  ° °[place] Behavioral Health Resources °Organization         Address  Phone  Notes  °[facility]  (888) 581-9988   °Julie Brannon, PhD [address] [place], [place]   (336) 349-5553 or (336) 951-0000   °[facility]   [address] °[place], [place] (336) 349-4454   °[facility] [address], [place], [place] (336) 342-8316 Insurance/Medicaid/sponsorship through [facility]  °[facility] [address]                                    [place], [place] (336) 342-8316 Therapy/tele-psych/case    °[facility] [address]  ° [place],  (336) 349-2233    °Dr. Arfeen  (336) 349-4544   °[facility] [place]  [employer] [place] [facility] 1) [address], [place] °2) [address], [place] °3)  [address], [place] (336) 349-3220 °(336) 342-7768 ° °(336) 342-8140   °[place] [facility] (336) 342-1394 or (336) 342-3537 (After Hours)    ° ° °

## 2015-03-04 NOTE — ED Notes (Signed)
Pt stated that he drank a fifth of liquor (tequilla), brown liquor, and corn liquor. Also Snorted a gram of cocaine and smoked marijuana.  Pt has been awake for 2 days straight without sleep.

## 2015-03-04 NOTE — ED Notes (Signed)
Pt presents with onset of palpitations that began this morning.  Pt reports using cocaine at 0700 this morning.  Pt reports chest pain and dizziness

## 2015-03-04 NOTE — ED Provider Notes (Signed)
CSN: [id number]     Arrival date & time 03/04/15  1112 History   First MD Initiated Contact with Patient 03/04/15 1130     No chief complaint on file.    (Consider location/radiation/quality/duration/timing/severity/associated sxs/prior Treatment) HPI   Patient with left-sided chest pain, shortness of breath, anxiety for the last 5 hours has not gotten any better and has not gotten worse. Pain is more of a tingly sharp type of pain in her pressure. Some nausea but no vomiting no other symptoms. His abdomen multiple episodes before but never this bad. All is associated with drug use for which he had done all night last night. Patient states he did cocaine marijuana smoke cigarettes and drink liquor throughout the night. No fevers no cough no recent travels no rashes.  Past Medical History  Diagnosis Date  . Hypertension   . Anxiety    Past Surgical History  Procedure Laterality Date  . No past surgeries     No family history on file. History  Substance Use Topics  . Smoking status: Current Every Day Smoker -- 1.00 packs/day    Types: Cigarettes    Last Attempt to Quit: 10/15/2013  . Smokeless tobacco: Not on file  . Alcohol Use: Yes     Comment: every weekend    Review of Systems  Constitutional: Negative for fever and chills.  Eyes: Negative for pain.  Respiratory: Positive for shortness of breath. Negative for cough.   Cardiovascular: Negative for chest pain.  Gastrointestinal: Positive for nausea. Negative for vomiting and abdominal pain.  Endocrine: Negative for polydipsia and polyuria.  Skin: Negative for rash and wound.  Neurological: Negative for numbness.       'tingling feeling' on left side of body  All other systems reviewed and are negative.     Allergies  Latex  Home Medications   Prior to Admission medications   Medication Sig Start Date End Date Taking? Authorizing Provider  LORazepam (ATIVAN) 1 MG tablet Take 1 tablet (1 mg total) by mouth 3  (three) times daily as needed for anxiety. 09/16/14  Yes Mancel Bale, MD   BP 118/86 mmHg  Pulse 60  Temp(Src) 96.4 F (35.8 C) (Oral)  Resp 20  Ht 5\' 9"  (1.753 m)  Wt 186 lb (84.369 kg)  BMI 27.45 kg/m2  SpO2 100% Physical Exam  Constitutional: He is oriented to person, place, and time. He appears well-developed and well-nourished.  HENT:  Head: Normocephalic and atraumatic.  Eyes: Pupils are equal, round, and reactive to light.  Cardiovascular: Normal rate and regular rhythm.   Pulmonary/Chest: Effort normal.  Abdominal: Soft. He exhibits no distension. There is no tenderness.  Neurological: He is alert and oriented to person, place, and time.  Skin: No rash noted.  Nursing note and vitals reviewed.   ED Course  Procedures (including critical care time) Labs Review Labs Reviewed  I-STAT CHEM 8, ED - Abnormal; Notable for the following:    Creatinine, Ser 1.30 (*)    All other components within normal limits  Rosezena Sensor, ED    Imaging Review Dg Chest 2 View  03/04/2015   CLINICAL DATA:  Hypertension and anxiety.  Heavy recent drug use  EXAM: CHEST  2 VIEW  COMPARISON:  February 22, 2015  FINDINGS: The lungs are clear. The heart size and pulmonary vascularity are normal. No adenopathy. No pneumothorax. No bone lesions.  IMPRESSION: No abnormality noted.   Electronically Signed   By: Bretta Bang III M.D.  On: 03/04/2015 13:21     EKG Interpretation None      MDM   Final diagnoses:  Anxiety    29 year old male here with a likely cocaine intoxication and anxiety. Here multiple times for the same thing recently. Have low risk and concern for ACS however with cocaine and left-sided symptoms and will check a troponin. This is been persistent for 5 hours; troponin is okay. I will also treat him symptomatically with some fluids and benzodiazepines. We'll check a baseline i-STAT kidney function as well. If these are negative and patient is improved likely stable for  discharge. Counseling provided on cocaine abstinence.  Labs negative, xr negative, patient counseled on quitting illicit drugs.   I have personally and contemperaneously reviewed labs and imaging and used in my decision making as above.   A medical screening exam was performed and I feel the patient has had an appropriate workup for their chief complaint at this time and likelihood of emergent condition existing is low. They have been counseled on decision, discharge, follow up and which symptoms necessitate immediate return to the emergency department. They or their family verbally stated understanding and agreement with plan and discharged in stable condition.      Marily Memos, MD 03/04/15 2107

## 2015-03-04 NOTE — ED Notes (Signed)
NAD at this time. Pt is stable and leaving with his mother.   

## 2015-03-04 NOTE — Discharge Planning (Signed)
NCM reviewed pt notes to see if he would be eligible for [facility] letter.  PT is NOT eligible due to having received on 05/02/15.

## 2015-03-04 NOTE — ED Notes (Signed)
Pt states "I did a lot of cocaine last night and drank a lot of liquor and smoked some weed; I did about a gram of coke"

## 2015-04-10 ENCOUNTER — Emergency Department (HOSPITAL_COMMUNITY): Payer: Self-pay

## 2015-04-10 ENCOUNTER — Emergency Department (HOSPITAL_COMMUNITY)
Admission: EM | Admit: 2015-04-10 | Discharge: 2015-04-11 | Disposition: A | Discharge status: Home / Self Care | Payer: Self-pay | Attending: Emergency Medicine | Admitting: Emergency Medicine

## 2015-04-10 ENCOUNTER — Encounter (HOSPITAL_COMMUNITY): Payer: Self-pay | Admitting: Nurse Practitioner

## 2015-04-10 DIAGNOSIS — F1410 Cocaine abuse, uncomplicated: Secondary | ICD-10-CM | POA: Insufficient documentation

## 2015-04-10 DIAGNOSIS — I10 Essential (primary) hypertension: Secondary | ICD-10-CM | POA: Insufficient documentation

## 2015-04-10 DIAGNOSIS — E860 Dehydration: Secondary | ICD-10-CM | POA: Insufficient documentation

## 2015-04-10 DIAGNOSIS — R072 Precordial pain: Secondary | ICD-10-CM | POA: Insufficient documentation

## 2015-04-10 DIAGNOSIS — Z91040 Latex allergy status: Secondary | ICD-10-CM | POA: Insufficient documentation

## 2015-04-10 DIAGNOSIS — F1010 Alcohol abuse, uncomplicated: Secondary | ICD-10-CM | POA: Insufficient documentation

## 2015-04-10 DIAGNOSIS — Z720 Tobacco use: Secondary | ICD-10-CM | POA: Insufficient documentation

## 2015-04-10 DIAGNOSIS — F419 Anxiety disorder, unspecified: Secondary | ICD-10-CM | POA: Insufficient documentation

## 2015-04-10 LAB — COMPREHENSIVE METABOLIC PANEL
ALBUMIN: 4.5 g/dL (ref 3.5–5.0)
ALT: 22 U/L (ref 17–63)
AST: 21 U/L (ref 15–41)
Alkaline Phosphatase: 56 U/L (ref 38–126)
Anion gap: 14 (ref 5–15)
BUN: 12 mg/dL (ref 6–20)
CHLORIDE: 101 mmol/L (ref 101–111)
CO2: 21 mmol/L — ABNORMAL LOW (ref 22–32)
Calcium: 9.4 mg/dL (ref 8.9–10.3)
Creatinine, Ser: 1.36 mg/dL — ABNORMAL HIGH (ref 0.61–1.24)
GFR calc Af Amer: >60 mL/min (ref >60)
GFR calc non Af Amer: >60 mL/min (ref >60)
GLUCOSE: 80 mg/dL (ref 65–99)
POTASSIUM: 4.1 mmol/L (ref 3.5–5.1)
Sodium: 136 mmol/L (ref 135–145)
Total Bilirubin: 1.3 mg/dL — ABNORMAL HIGH (ref 0.3–1.2)
Total Protein: 7.7 g/dL (ref 6.5–8.1)

## 2015-04-10 LAB — CBC WITH DIFFERENTIAL/PLATELET
BASOS ABS: 0 10*3/uL (ref 0.0–0.1)
BASOS PCT: 1 % (ref 0–1)
EOS PCT: 0 % (ref 0–5)
Eosinophils Absolute: 0 10*3/uL (ref 0.0–0.7)
HEMATOCRIT: 47.1 % (ref 39.0–52.0)
Hemoglobin: 15.8 g/dL (ref 13.0–17.0)
Lymphocytes Relative: 37 % (ref 12–46)
Lymphs Abs: 2 10*3/uL (ref 0.7–4.0)
MCH: 29.5 pg (ref 26.0–34.0)
MCHC: 33.5 g/dL (ref 30.0–36.0)
MCV: 87.9 fL (ref 78.0–100.0)
MONO ABS: 0.4 10*3/uL (ref 0.1–1.0)
Monocytes Relative: 7 % (ref 3–12)
NEUTROS ABS: 2.9 10*3/uL (ref 1.7–7.7)
Neutrophils Relative %: 55 % (ref 43–77)
PLATELETS: 229 10*3/uL (ref 150–400)
RBC: 5.36 MIL/uL (ref 4.22–5.81)
RDW: 12.6 % (ref 11.5–15.5)
WBC: 5.3 10*3/uL (ref 4.0–10.5)

## 2015-04-10 LAB — I-STAT TROPONIN, ED
TROPONIN I, POC: 0 ng/mL (ref 0.00–0.08)
Troponin i, poc: 0 ng/mL (ref 0.00–0.08)

## 2015-04-10 LAB — LIPASE, BLOOD: Lipase: 18 U/L — ABNORMAL LOW (ref 22–51)

## 2015-04-10 LAB — TROPONIN I: Troponin I: <0.03 ng/mL (ref <0.031)

## 2015-04-10 MED ORDER — SODIUM CHLORIDE 0.9 % IV BOLUS (SEPSIS)
1000.0000 mL | Freq: Once | INTRAVENOUS | Status: AC
  Administered 2015-04-10: 1000 mL via INTRAVENOUS

## 2015-04-10 MED ORDER — LORAZEPAM 2 MG/ML IJ SOLN
1.0000 mg | Freq: Once | INTRAMUSCULAR | Status: AC
Start: 2015-04-10 — End: 2015-04-10
  Administered 2015-04-10: 1 mg via INTRAVENOUS
  Filled 2015-04-10: qty 1

## 2015-04-10 NOTE — ED Provider Notes (Signed)
CSN: [id number]     Arrival date & time 04/10/15  1806 History   First MD Initiated Contact with Patient 04/10/15 1924     Chief Complaint  Patient presents with  . Chest Pain   Patient is a 29 y.o. male presenting with general illness. The history is provided by the patient. No language interpreter was used.  Illness Location:  Substernal chest Quality:  Pain Severity:  Mild Onset quality:  Gradual Timing:  Intermittent Progression:  Waxing and waning Chronicity:  Recurrent Context:  PMHx of HTN and anxiety presenting with substernal chest pain onset earlier today. No radiation. Not exertional. Denies associated shortness of breath, diaphoresis, or emesis. Patient does note associated headache. Patient admits drinking alcohol and using cocaine yesterday evening and has experienced similar chest pain in the past in association with substance abuse. Associated symptoms: chest pain   Associated symptoms: no abdominal pain, no congestion, no cough, no diarrhea, no fever, no headaches, no loss of consciousness, no nausea, no shortness of breath, no vomiting and no wheezing     Past Medical History  Diagnosis Date  . Hypertension   . Anxiety    Past Surgical History  Procedure Laterality Date  . No past surgeries     History reviewed. No pertinent family history. Social History  Substance Use Topics  . Smoking status: Current Every Day Smoker -- 1.00 packs/day    Types: Cigarettes    Last Attempt to Quit: 10/15/2013  . Smokeless tobacco: None  . Alcohol Use: Yes     Comment: every weekend    Review of Systems  Constitutional: Negative for fever and chills.  HENT: Negative for congestion.   Respiratory: Negative for cough, shortness of breath and wheezing.   Cardiovascular: Positive for chest pain. Negative for palpitations and leg swelling.  Gastrointestinal: Negative for nausea, vomiting, abdominal pain and diarrhea.  Neurological: Negative for dizziness, loss of  consciousness, light-headedness and headaches.  All other systems reviewed and are negative.   Allergies  Latex  Home Medications   Prior to Admission medications   Medication Sig Start Date End Date Taking? Authorizing Provider  albuterol (PROVENTIL HFA;VENTOLIN HFA) 108 (90 BASE) MCG/ACT inhaler Inhale 2 puffs into the lungs every 4 (four) hours as needed for wheezing or shortness of breath.   Yes Historical Provider, MD  ibuprofen (ADVIL,MOTRIN) 800 MG tablet Take 400 mg by mouth daily as needed (pain).   Yes Historical Provider, MD  LORazepam (ATIVAN) 1 MG tablet Take 1 tablet (1 mg total) by mouth 3 (three) times daily as needed for anxiety. Patient taking differently: Take 1 mg by mouth daily as needed for anxiety (panic attack).  09/16/14   Mancel Bale, MD   BP 124/87 mmHg  Pulse 89  Temp(Src) 98.4 F (36.9 C) (Oral)  Resp 19  SpO2 100%   Physical Exam  Constitutional: He is oriented to person, place, and time. He appears well-developed and well-nourished. No distress.  Mildly anxious  Eyes: Conjunctivae are normal. Pupils are equal, round, and reactive to light.  Neck: Normal range of motion. Neck supple.  Cardiovascular: Normal rate, regular rhythm and intact distal pulses.   Pulmonary/Chest: Effort normal and breath sounds normal. He has no wheezes. He exhibits no tenderness.  Abdominal: Soft. Bowel sounds are normal. He exhibits no distension. There is no tenderness. There is no rebound.  Musculoskeletal: Normal range of motion.  Neurological: He is alert and oriented to person, place, and time.  Skin: Skin is warm  and dry. He is not diaphoretic.    ED Course  Procedures   Labs Review Labs Reviewed  COMPREHENSIVE METABOLIC PANEL - Abnormal; Notable for the following:    CO2 21 (*)    Creatinine, Ser 1.36 (*)    Total Bilirubin 1.3 (*)    All other components within normal limits  LIPASE, BLOOD - Abnormal; Notable for the following:    Lipase 18 (*)    All  other components within normal limits  CBC WITH DIFFERENTIAL/PLATELET  TROPONIN I  I-STAT TROPOININ, ED  Rosezena Sensor, ED   Imaging Review Dg Chest 2 View  04/10/2015   CLINICAL DATA:  Acute shortness of breath and palpitations today.  EXAM: CHEST  2 VIEW  COMPARISON:  03/04/2015 and prior radiographs  FINDINGS: The cardiomediastinal silhouette is unremarkable.  There is no evidence of focal airspace disease, pulmonary edema, suspicious pulmonary nodule/mass, pleural effusion, or pneumothorax. No acute bony abnormalities are identified.  IMPRESSION: No active cardiopulmonary disease.   Electronically Signed   By: Harmon Pier M.D.   On: 04/10/2015 20:05   I have personally reviewed and evaluated these images and lab results as part of my medical decision-making.   EKG Interpretation   Date/Time:  Saturday April 10 2015 18:12:51 EDT Ventricular Rate:  77 PR Interval:  184 QRS Duration: 90 QT Interval:  354 QTC Calculation: 400 R Axis:   91 Text Interpretation:  Normal sinus rhythm with sinus arrhythmia Rightward  axis Nonspecific T wave abnormality Abnormal ECG No significant change was  found Confirmed by [facility]  MD, TREY ([id number]) on 04/10/2015 10:54:02 PM      MDM  Mr. Hopkin is a 29 yo male with PMHx of HTN and anxiety presenting with substernal chest pain onset earlier today. No radiation. Not exertional. Denies associated shortness of breath, diaphoresis, or emesis. Patient does note associated headache. Patient admits drinking alcohol and using cocaine yesterday evening and has experienced similar chest pain in the past in association with substance abuse.  Physical exam above notable for young male lying in stretcher in no acute distress. Mildly anxious. Afebrile. Not tachycardic. Breathing well on room air and maintaining saturations without supplemental oxygen. Abdomen benign. Patient does have a palpable cord to right upper extremity. CV showing a regular rate and rhythm.  Chest pain not reproducible.  EKG showing no ST elevation/depression or T-wave changes. Troponin 2 undetectable. CBC unremarkable. CMP showing mild AK I with creatinine 1.36. Lipase 18. Chest x-ray showing no acute cardiopulmonary process.  Presentation consistent with cocaine induced chest pain as well as dehydration. Patient given IV fluids and benzodiazepines with complete relief of pain. Patient feeling better and is tolerating by mouth fluids in the room. Patient formed about the risk of using cocaine as well as abusing alcohol. Resources for rehabilitation program provided. Patient advised to continue with increased fluid intake over the next several days.  Patient discharged home in stable condition. Strict ED return pressure discussed. Patient understands and agrees with plan and has no further questions or concerns this time.  Patient care discussed with and followed by my attending, Dr. Blake Divine.   Final diagnoses:  Precordial pain  Cocaine abuse  ETOH abuse  Dehydration     Angelina Ok, MD 04/11/15 1610  Blake Divine, MD 04/11/15 2250

## 2015-04-10 NOTE — ED Notes (Signed)
Pt reports using alcohol and cocaine heavily last night until the early morning. Since he stopped he feels unable to catch his breath, his heart is pounding, he feels dizzy, weak, adn anxious. He took ibuprofen for a headache earlier today with relief of the headache. He is A&Ox4, breathing easily

## 2015-04-10 NOTE — ED Notes (Signed)
Family at bedside. 

## 2015-04-11 NOTE — ED Notes (Signed)
Pt.left with all belongings. Discharge instructions were reviewed and all questions were answered 

## 2015-04-11 NOTE — Discharge Instructions (Signed)
°Emergency Department Resource Guide °1) Find a Doctor and Pay Out of Pocket °Although you won't have to find out who is covered by your insurance plan, it is a good idea to ask around and get recommendations. You will then need to call the office and see if the doctor you have chosen will accept you as a new patient and what types of options they offer for patients who are self-pay. Some doctors offer discounts or will set up payment plans for their patients who do not have insurance, but you will need to ask so you aren't surprised when you get to your appointment. ° °2) Contact Your Local Health Department °Not all health departments have doctors that can see patients for sick visits, but many do, so it is worth a call to see if yours does. If you don't know where your local health department is, you can check in your phone book. The CDC also has a tool to help you locate your state's health department, and many state websites also have listings of all of their local health departments. ° °3) Find a Walk-in Clinic °If your illness is not likely to be very severe or complicated, you may want to try a walk in clinic. These are popping up all over the country in pharmacies, drugstores, and shopping centers. They're usually staffed by nurse practitioners or physician assistants that have been trained to treat common illnesses and complaints. They're usually fairly quick and inexpensive. However, if you have serious medical issues or chronic medical problems, these are probably not your best option. ° °No Primary Care Doctor: °- Call [facility] at  832-8000 - they can help you locate a primary care doctor that  accepts your insurance, provides certain services, etc. °- Physician Referral Service- 1-800-533-3463 ° °Chronic Pain Problems: °Organization         Address  Phone   Notes  °[facility]  (336) 297-2271 Patients need to be referred by their primary care doctor.  ° °Medication  Assistance: °Organization         Address  Phone   Notes  °[place] [facility] [address] °[place], [place] [address] (336) 641-8030 --Must be a resident of [place] °-- Must have NO insurance coverage whatsoever (no Medicaid/ Medicare, etc.) °-- The pt. MUST have a primary care doctor that directs their care regularly and follows them in the community °  °[facility]  (866) 331-1348   °[employer]  (888) 892-1162   ° °Agencies that provide inexpensive medical care: °Organization         Address  Phone   Notes  °[facility]  (336) 832-8035   °[facility]    (336) 832-7272   °[facility] [address] °[place], [place] [address] (336) 832-4777   °[facility] [address], °[place] (336) 271-4999   °[facility]    (336) 373-0678   °[facility]    (336) 272-1050   °[facility] ° [address], [place] Phone:  (336) 832-4444, Fax:  (336) 832-4440 Hours of Operation:  9 am - 6 pm, M-F.  Also accepts Medicaid/Medicare and self-pay.  °[facility] ° [address],  Phone: (336) 832-3150, Fax: (336) 832-3151. Hours of Operation:  8:30 am - 5:30 pm, M-F.  Also accepts Medicaid and self-pay.  °[facility] [place] [address], [place] Phone: (336) 878-6027   °[facility] [address], [place], [place] (336)723-1848, Ext. 123 Mondays & Thursdays: 7-9 AM.  First 15 patients are seen on a first come, first serve basis. °  ° °Medicaid-accepting [place] Providers: ° °Organization         Address  Phone   Notes  °[facility] [address], [place] (336) 641-2100 Also accepts self-pay patients.  °[facility] [address], [place] ° (336) 856-9996   °[facility] [address], [place]  (336) 288-8857   °[facility] [address] [place] [address], [place] (336) 299-7000   °Veita Bland [address], [place]  ° (336) 373-1557 Only accepts [place] Access Medicaid patients after they have their name applied to their card.  ° °Self-Pay (no insurance) in [place]: ° °Organization         Address  Phone   Notes  °Sickle Cell Patients, [facility] [address], [place] (336) 832-1970   °[facility] [address], [place] (336) 832-4400   °[facility] ° [address], [place] (336) 992-4800   °[facility]/Dr. Osei-Bonsu ° [address] [place] [address], [place] or [address], [place] (336) 841-8500 Phone number for both [place] and [place] locations is the same.  °[facility] [address], [place] (336) 299-0000   °[facility] [address] [place] [address], [place] or [address] (336) 852-7530 °(336) 878-2260   °[facility] [address], [place] (336) 350-1642, phone; (336) 294-5005, fax Sees patients 1st and 3rd Saturday of every month.  Must not qualify for public or private insurance (i.e. Medicaid, Medicare, [place] Health Choice, Veterans' Benefits) • Household income should be no more than 200% of the poverty level •The clinic cannot treat you if you are pregnant or think you are pregnant • Sexually transmitted diseases are not treated at the clinic.  ° ° °Dental Care: °Organization         Address  Phone  Notes  °[place] [facility] [address], [place] (336) 641-6152 Accepts children up to age 21 who are enrolled in Medicaid or [place] Health Choice; pregnant women with a Medicaid card; and children who have applied for Medicaid or [place] Health Choice, but were declined, whose parents can pay a reduced fee at time of service.  °[place]  [facility] [place]  [address], [place] (336) 641-7733 Accepts children up to age 21 who are enrolled in Medicaid or [place] Health Choice; pregnant women with a Medicaid card; and children who have applied for Medicaid or [place] Health Choice, but were declined, whose parents can pay a reduced fee at time of service.  °[facility] ° [address], [place] (336) 641-4533 Patients are seen by appointment only. Walk-ins are not accepted. [facility] will see patients 18 years of age and older. °Monday - Tuesday (8am-5pm) °Most Wednesdays (8:30-5pm) °$30 per visit, cash only  °[facility] ° [address], [place] (336) 641-4533 Patients are seen by appointment only. Walk-ins are not accepted. [facility] will see patients 18 years of age and older. °One   Wednesday Evening (Monthly: Volunteer Based).  $30 per visit, cash only  °[facility]  (919) 537-3737 for adults; Children under age 4, call Graduate Pediatric Dentistry at (919) [phone]. Children aged 4-14, please call (919) [phone] to request a pediatric application. ° Dental services are provided in all areas of dental care including fillings, crowns and bridges, complete and partial dentures, implants, gum treatment, root canals, and extractions. Preventive care is also provided. Treatment is provided to both adults and children. °Patients are selected via a lottery and there is often a waiting list. °  °[facility] [address], °[place] ° (336) 763-8833 [email or web address] °  °[facility] [address], [place], [place] (336)723-1848, Ext. 123 Second and Fourth Thursday of each month, opens at 6:30 AM; Clinic ends at 9 AM.  Patients are seen on a first-come first-served basis, and a limited number are seen during each clinic.  ° °[facility] ° [address], [place], [place] (336) 723-7904    Eligibility Requirements °You must have lived in [place], [place], or [place] counties for at least the last three months. °  You cannot be eligible for state or federal sponsored healthcare insurance, including Veterans Administration, Medicaid, or Medicare. °  You generally cannot be eligible for healthcare insurance through your employer.  °  How to apply: °Eligibility screenings are held every Tuesday and Wednesday afternoon from 1:00 pm until 4:00 pm. You do not need an appointment for the interview!  °[facility] [address], [place], [place] 336-631-2330   °[facility]  336-342-8273   °[facility]  336-703-3100   °[facility]  336-570-6415   ° °Behavioral Health Resources in the Community: °Intensive Outpatient Programs °Organization         Address  Phone  Notes  °[facility] [address], [place], [place] 336-878-6098   °[facility] [address], [place], [place] 336-832-9800   °[facility] [address], [place], [place] ° 336-882-2125   °[facility] [address],  °[place], [place] 1-800-853-5163 or 336-641-4981   °Substance Abuse Resources °Organization         Address  Phone  Notes  °[facility]  336-882-2125   °[facility]  336-784-9470   °[facility]  336-285-9073   °[facility]  336-845-3988   °Residential & Outpatient Substance Abuse Program  1-800-659-3381   °Psychological Services °Organization         Address  Phone  Notes  °[facility]  336- 832-9600   °[facility]  336- 378-7881   °[facility] [address], [place] 1-800-853-5163 or 336-641-4981   ° °Mobile Crisis Teams °Organization         Address  Phone  Notes  °[facility], Mobile Crisis Care Unit  1-877-626-1772   °Assertive °[facility] ° [address]  [place], [place] 336-834-9664   °Sharon DeEsch [address] °[place] 336-554-5454   ° °Self-Help/Support Groups °Organization         Address  Phone             Notes  °[facility] [place] - variety of support groups  336- 373-1402 Call for more information  °Narcotics Anonymous (NA), [facility] [address], °[place]  2 meetings at this location  ° °  Residential Treatment Programs °Organization         Address  Phone  Notes  °[facility] [address],    °[place]  1-866-801-8205   °[facility] ° [address], [place], [place] 704-293-8524   °[facility] [address], [place] 336-845-3988 Admissions: 8am-3pm M-F  °[facility] [address]    °[place], [place] 336-841-1104   °[facility] [address], [place], [place] 336-379-7146   °[facility] [address]  °[place], [place] 336-285-9073   °[facility] - Intensive Outpatient [address], [place], [place] 336-852-3033   °[facility] [address]  °[place], [place] 1-877-615-2722 or 336-784-9470   °[facility] [address] [place], [place] 336-227-7417 Accepts Medicaid  °[facility] [address]  °[place] 1-800-659-3381 Substance Abuse/Addiction Treatment  ° °[place] Behavioral Health Resources °Organization         Address  Phone  Notes  °[facility]  (888) 581-9988   °Julie Brannon, PhD [address] [place], [place]   (336) 349-5553 or (336) 951-0000   ° Behavioral   [address] °[place], [place] (336) 349-4454   °[facility] [address], [place], [place] (336) 342-8316 Insurance/Medicaid/sponsorship through [facility]  °[facility] [address]                                    [place], [place] (336) 342-8316 Therapy/tele-psych/case    °[facility] [address]  ° [place], [place] (336) 349-2233    °Dr. Arfeen  (336) 349-4544   °[facility] [place]  [employer] [place] [facility] 1) [address],  °2) [address], [place] °3)  [address], [place] (336) 349-3220 °(336) 342-7768 ° °(336) 342-8140   °[place] [facility] (336) 342-1394 or (336) 342-3537 (After Hours)    ° ° °

## 2015-04-21 ENCOUNTER — Encounter (HOSPITAL_COMMUNITY): Payer: Self-pay | Admitting: Emergency Medicine

## 2015-04-21 ENCOUNTER — Emergency Department (HOSPITAL_COMMUNITY)
Admission: EM | Admit: 2015-04-21 | Discharge: 2015-04-21 | Disposition: A | Discharge status: Home / Self Care | Payer: Self-pay | Attending: Emergency Medicine | Admitting: Emergency Medicine

## 2015-04-21 ENCOUNTER — Emergency Department (HOSPITAL_COMMUNITY): Payer: Self-pay

## 2015-04-21 DIAGNOSIS — G43809 Other migraine, not intractable, without status migrainosus: Secondary | ICD-10-CM

## 2015-04-21 DIAGNOSIS — F419 Anxiety disorder, unspecified: Secondary | ICD-10-CM | POA: Insufficient documentation

## 2015-04-21 DIAGNOSIS — Z720 Tobacco use: Secondary | ICD-10-CM | POA: Insufficient documentation

## 2015-04-21 DIAGNOSIS — Z91040 Latex allergy status: Secondary | ICD-10-CM | POA: Insufficient documentation

## 2015-04-21 DIAGNOSIS — I10 Essential (primary) hypertension: Secondary | ICD-10-CM | POA: Insufficient documentation

## 2015-04-21 DIAGNOSIS — Z79899 Other long term (current) drug therapy: Secondary | ICD-10-CM | POA: Insufficient documentation

## 2015-04-21 MED ORDER — METOCLOPRAMIDE HCL 5 MG/ML IJ SOLN
10.0000 mg | Freq: Once | INTRAMUSCULAR | Status: AC
  Administered 2015-04-21: 10 mg via INTRAVENOUS
  Filled 2015-04-21: qty 2

## 2015-04-21 MED ORDER — SODIUM CHLORIDE 0.9 % IV BOLUS (SEPSIS)
1000.0000 mL | Freq: Once | INTRAVENOUS | Status: AC
  Administered 2015-04-21: 1000 mL via INTRAVENOUS

## 2015-04-21 MED ORDER — DIPHENHYDRAMINE HCL 50 MG/ML IJ SOLN
25.0000 mg | Freq: Once | INTRAMUSCULAR | Status: AC
Start: 2015-04-21 — End: 2015-04-21
  Administered 2015-04-21: 25 mg via INTRAVENOUS
  Filled 2015-04-21: qty 1

## 2015-04-21 MED ORDER — KETOROLAC TROMETHAMINE 30 MG/ML IJ SOLN
30.0000 mg | Freq: Once | INTRAMUSCULAR | Status: AC
  Administered 2015-04-21: 30 mg via INTRAVENOUS
  Filled 2015-04-21: qty 1

## 2015-04-21 NOTE — ED Notes (Addendum)
Pt c/o woke up with migraine this am at 10. States also feels like something is in right eye. Denies visual difficulty-- pt admits to using cocaine last night, states "worst headache in a long time"

## 2015-04-21 NOTE — ED Notes (Signed)
Patient transported to CT 

## 2015-04-21 NOTE — ED Provider Notes (Signed)
CSN: [id number]     Arrival date & time 04/21/15  1240 History   First MD Initiated Contact with Patient 04/21/15 1314     Chief Complaint  Patient presents with  . Migraine  . Eye Pain     (Consider location/radiation/quality/duration/timing/severity/associated sxs/prior Treatment) HPI Comments: Patient is a 29 year old male with a past medical history of migraines who presents with a severe headache since this morning. Patient reports a gradual onset and progressive worsening of the headache. The pain is sharp, constant and is located in generalized head without radiation. Patient has tried nothing for symptoms without relief. No alleviating/aggravating factors. Patient reports associated nausea, right eye pain, and photophobia. Patient denies fever, vomiting, diarrhea, numbness/tingling, weakness, visual changes, congestion, chest pain, SOB, abdominal pain. Patient admits to using cocaine last night.      Past Medical History  Diagnosis Date  . Hypertension   . Anxiety    Past Surgical History  Procedure Laterality Date  . No past surgeries     No family history on file. Social History  Substance Use Topics  . Smoking status: Current Every Day Smoker -- 1.00 packs/day    Types: Cigarettes    Last Attempt to Quit: 10/15/2013  . Smokeless tobacco: None  . Alcohol Use: Yes     Comment: every weekend    Review of Systems  Neurological: Positive for headaches.  All other systems reviewed and are negative.     Allergies  Latex  Home Medications   Prior to Admission medications   Medication Sig Start Date End Date Taking? Authorizing Provider  albuterol (PROVENTIL HFA;VENTOLIN HFA) 108 (90 BASE) MCG/ACT inhaler Inhale 2 puffs into the lungs every 4 (four) hours as needed for wheezing or shortness of breath.    Historical Provider, MD  ibuprofen (ADVIL,MOTRIN) 800 MG tablet Take 400 mg by mouth daily as needed (pain).    Historical Provider, MD  LORazepam (ATIVAN) 1 MG  tablet Take 1 tablet (1 mg total) by mouth 3 (three) times daily as needed for anxiety. Patient taking differently: Take 1 mg by mouth daily as needed for anxiety (panic attack).  09/16/14   Mancel Bale, MD   BP 116/87 mmHg  Pulse 83  Temp(Src) 98.5 F (36.9 C) (Oral)  Resp 16  SpO2 96% Physical Exam  Constitutional: He is oriented to person, place, and time. He appears well-developed and well-nourished. No distress.  HENT:  Head: Normocephalic and atraumatic.  Eyes: Conjunctivae and EOM are normal.  Neck: Normal range of motion.  Cardiovascular: Normal rate and regular rhythm.  Exam reveals no gallop and no friction rub.   No murmur heard. Pulmonary/Chest: Effort normal and breath sounds normal. He has no wheezes. He has no rales. He exhibits no tenderness.  Abdominal: Soft. He exhibits no distension. There is no tenderness. There is no rebound.  Musculoskeletal: Normal range of motion.  Neurological: He is alert and oriented to person, place, and time. No cranial nerve deficit. Coordination normal.  Speech is goal-oriented. Moves limbs without ataxia.   Skin: Skin is warm and dry.  Psychiatric: He has a normal mood and affect. His behavior is normal.  Nursing note and vitals reviewed.   ED Course  Procedures (including critical care time) Labs Review Labs Reviewed - No data to display  Imaging Review Ct Head Wo Contrast  04/21/2015   CLINICAL DATA:  Diffuse headache. Photosensitivity. History of migraine headaches.  EXAM: CT HEAD WITHOUT CONTRAST  TECHNIQUE: Contiguous axial images  were obtained from the base of the skull through the vertex without intravenous contrast.  COMPARISON:  None.  FINDINGS: No mass lesion. No midline shift. No acute hemorrhage or hematoma. No extra-axial fluid collections. No evidence of acute infarction. Brain parenchyma is normal. Osseous structures are normal.  IMPRESSION: Normal exam.   Electronically Signed   By: Francene Boyers M.D.   On:  04/21/2015 14:01   I have personally reviewed and evaluated these images and lab results as part of my medical decision-making.   EKG Interpretation None      MDM   Final diagnoses:  Other migraine without status migrainosus, not intractable    3:30 PM CT head unremarkable for acute changes. Vitals stable and patient afebrile. No neuro deficits at this time. Patient feeling better after migraine cocktail. Patient discharged in stable condition.     Emilia Beck, PA-C 04/21/15 1542  Jerelyn Scott, MD 04/21/15 [phone]

## 2015-04-21 NOTE — ED Notes (Signed)
Pt has c/o something in his eye, nothing noted. Sclera is slightly red.

## 2016-01-17 ENCOUNTER — Emergency Department (HOSPITAL_COMMUNITY)
Admission: EM | Admit: 2016-01-17 | Discharge: 2016-01-17 | Discharge status: Against Medical Advice | Payer: Self-pay | Attending: Dermatology | Admitting: Dermatology

## 2016-01-17 ENCOUNTER — Encounter (HOSPITAL_COMMUNITY): Payer: Self-pay

## 2016-01-17 DIAGNOSIS — R5383 Other fatigue: Secondary | ICD-10-CM | POA: Insufficient documentation

## 2016-01-17 DIAGNOSIS — I10 Essential (primary) hypertension: Secondary | ICD-10-CM | POA: Insufficient documentation

## 2016-01-17 DIAGNOSIS — Z5321 Procedure and treatment not carried out due to patient leaving prior to being seen by health care provider: Secondary | ICD-10-CM | POA: Insufficient documentation

## 2016-01-17 DIAGNOSIS — F17210 Nicotine dependence, cigarettes, uncomplicated: Secondary | ICD-10-CM | POA: Insufficient documentation

## 2016-01-17 NOTE — ED Notes (Addendum)
Patient presents to the ED today asking for a work note. He feels fatigued from taking his ativan today  Pt. Denies any pain or dizziness

## 2016-01-17 NOTE — ED Notes (Signed)
Called pt's name twice. No show. Nurse was notified.

## 2016-01-17 NOTE — ED Notes (Signed)
No response in waiting room when this nurse called patients name

## 2016-01-17 NOTE — ED Notes (Signed)
Called for patient- no response in waiting room or subwaiting room

## 2016-02-08 IMAGING — CT CT HEAD W/O CM
1 series · 16 of 30 positions shown, 20 images · non-contrast
Comparison: None.

CLINICAL DATA: Diffuse headache. Photosensitivity. History of
migraine headaches.

EXAM:
CT HEAD WITHOUT CONTRAST
TECHNIQUE: Contiguous axial images were obtained from the base of the skull
through the vertex without intravenous contrast.

[Series 2: head 5.0 h30s · axial · 0.46mm/px · z∈[-170,-30]mm · 16 of 32 slices shown, 20 images]
[im 2/32  brain]
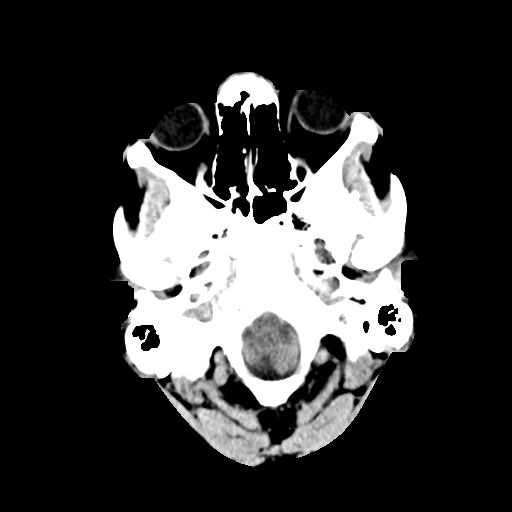
[im 2/32  bone]
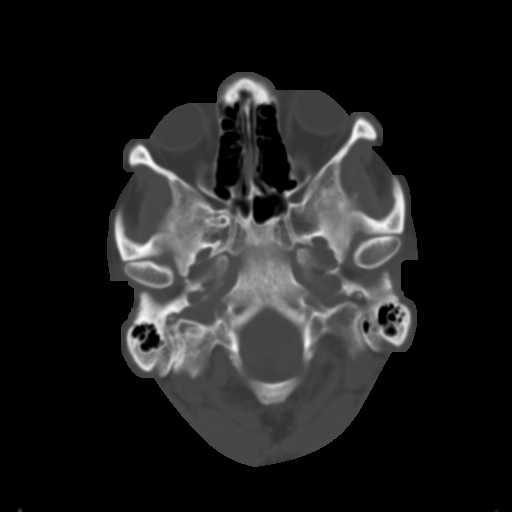
[im 4/32  brain]
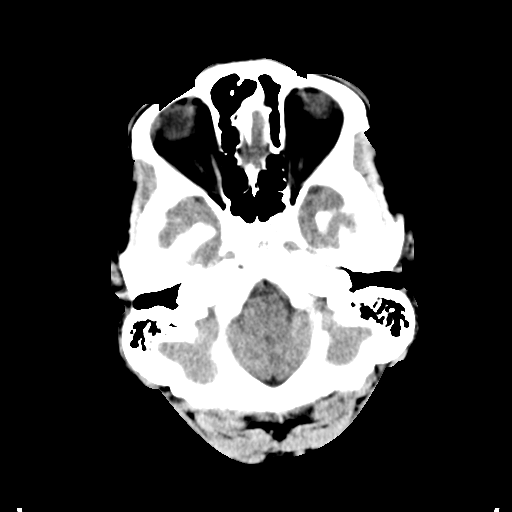
[im 6/32  brain]
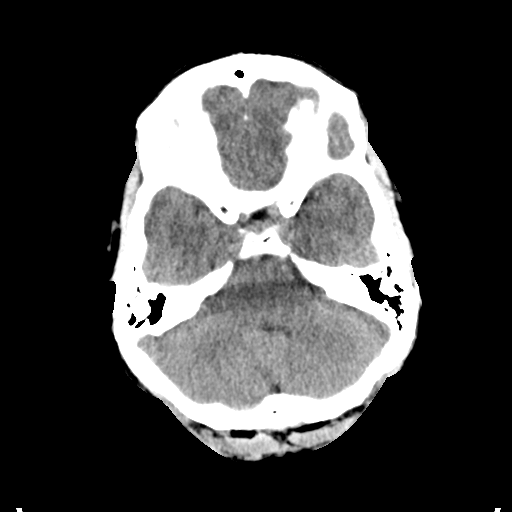
[im 8/32  brain]
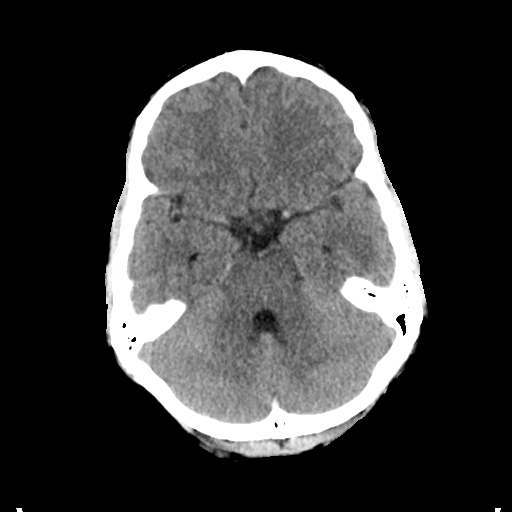
[im 9/32  brain]
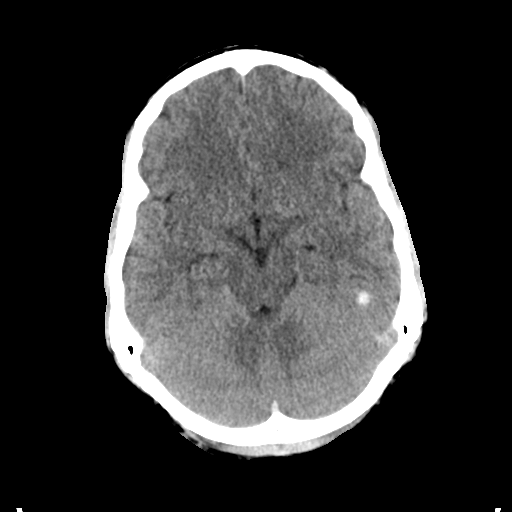
[im 9/32  bone]
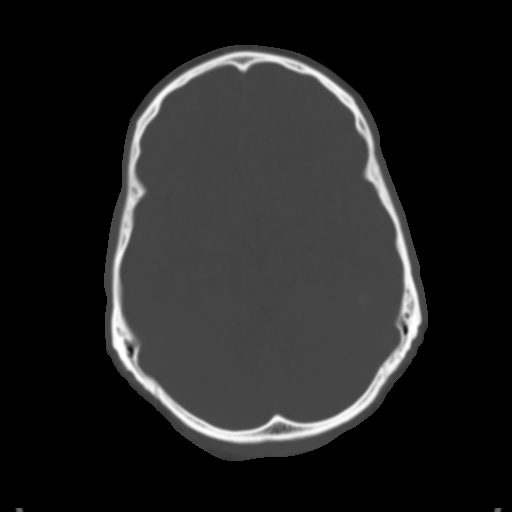
[im 11/32  brain]
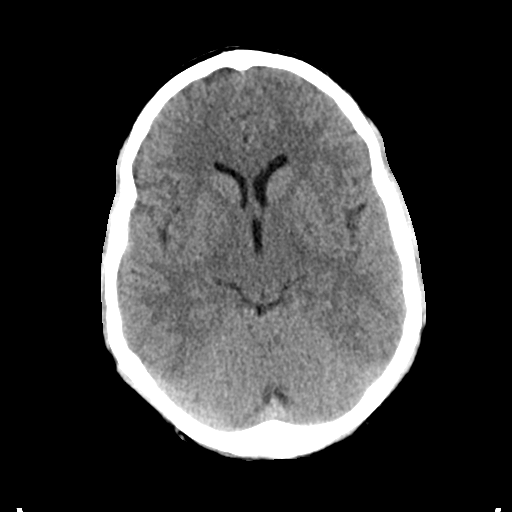
[im 13/32  brain]
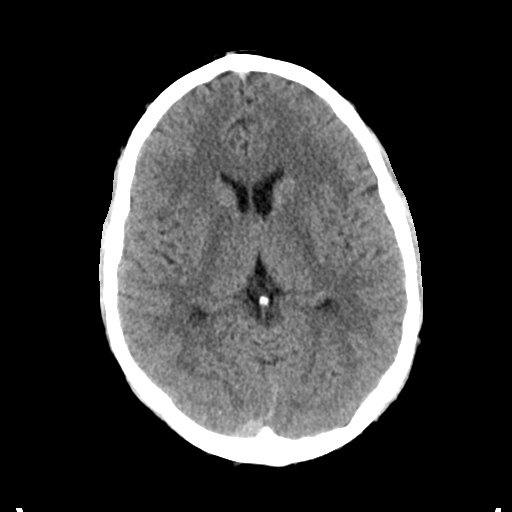
[im 15/32  brain]
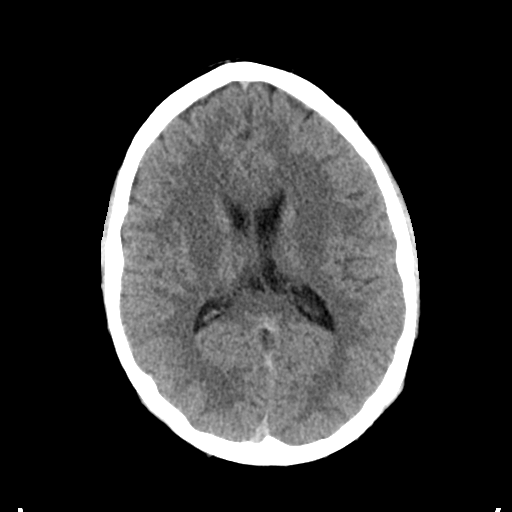
[im 17/32  brain]
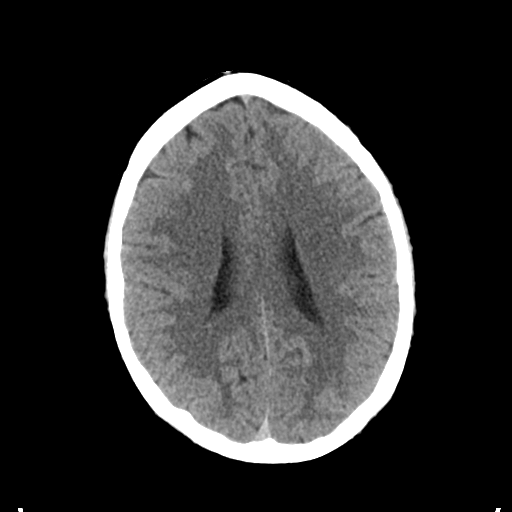
[im 17/32  bone]
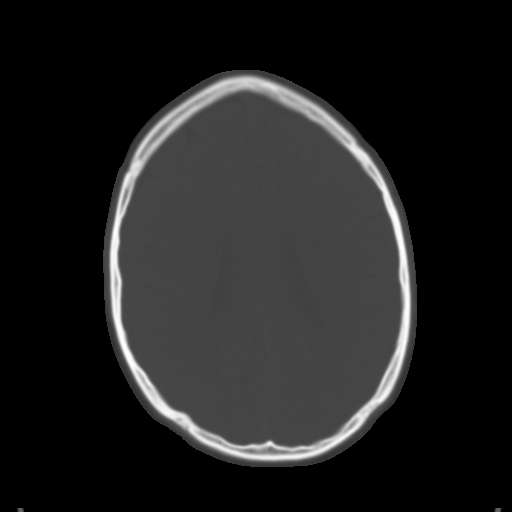
[im 19/32  brain]
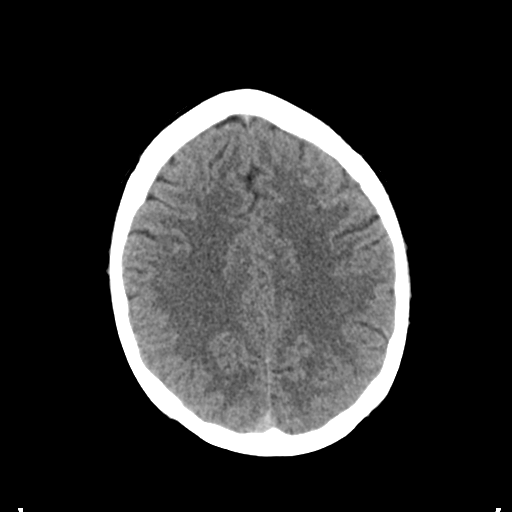
[im 21/32  brain]
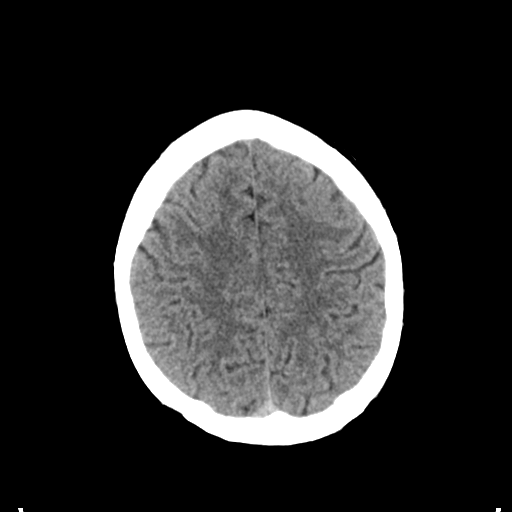
[im 23/32  brain]
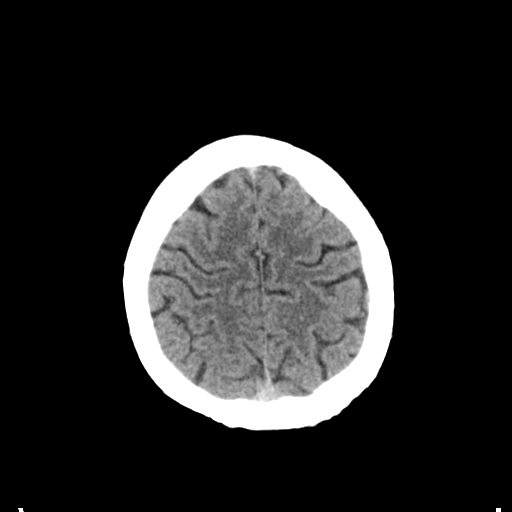
[im 24/32  brain]
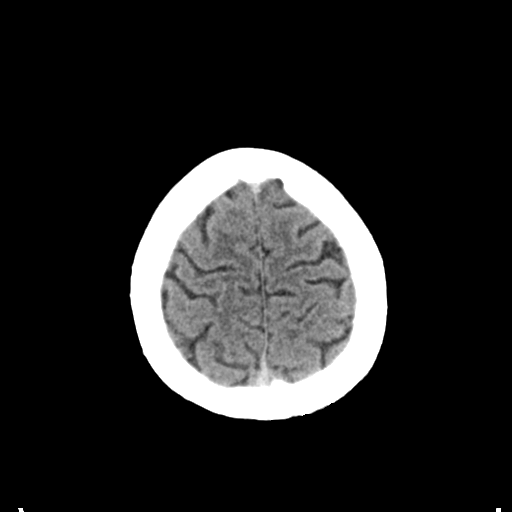
[im 24/32  bone]
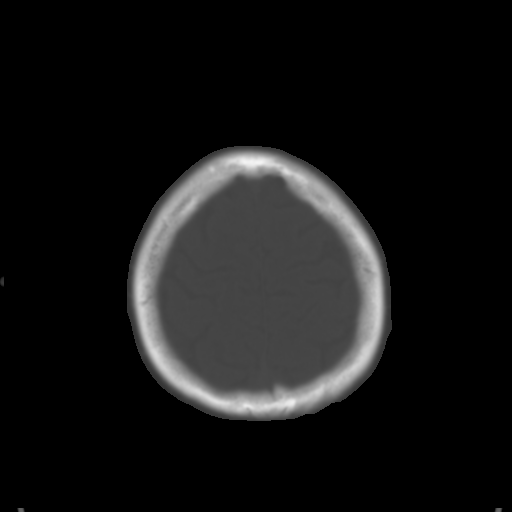
[im 26/32  brain]
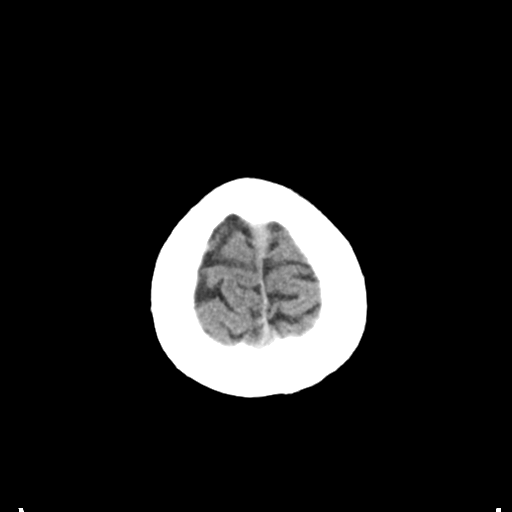
[im 28/32  brain]
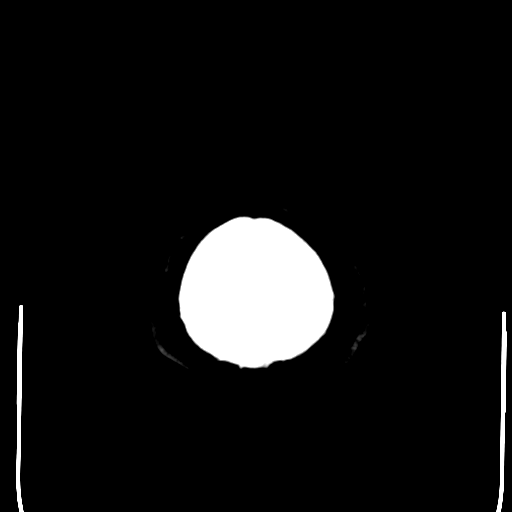
[im 30/32  brain]
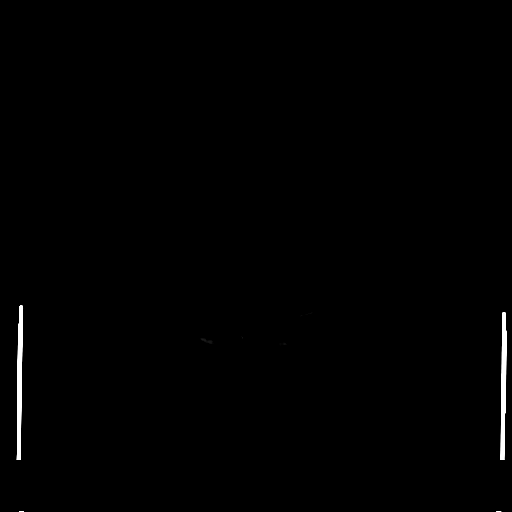

[16 of 30 positions shown; findings below may reference images not displayed]

FINDINGS: No mass lesion. No midline shift. No acute hemorrhage or hematoma.
No extra-axial fluid collections. No evidence of acute infarction.
Brain parenchyma is normal. Osseous structures are normal.
IMPRESSION: Normal exam.

## 2017-05-31 ENCOUNTER — Encounter (HOSPITAL_COMMUNITY): Payer: Self-pay | Admitting: *Deleted

## 2017-05-31 ENCOUNTER — Emergency Department (HOSPITAL_COMMUNITY)
Admission: EM | Admit: 2017-05-31 | Discharge: 2017-05-31 | Disposition: A | Discharge status: Home / Self Care | Payer: Self-pay | Attending: Emergency Medicine | Admitting: Emergency Medicine

## 2017-05-31 DIAGNOSIS — F1910 Other psychoactive substance abuse, uncomplicated: Secondary | ICD-10-CM | POA: Insufficient documentation

## 2017-05-31 DIAGNOSIS — F419 Anxiety disorder, unspecified: Secondary | ICD-10-CM | POA: Insufficient documentation

## 2017-05-31 DIAGNOSIS — Z91040 Latex allergy status: Secondary | ICD-10-CM | POA: Insufficient documentation

## 2017-05-31 DIAGNOSIS — Z79899 Other long term (current) drug therapy: Secondary | ICD-10-CM | POA: Insufficient documentation

## 2017-05-31 DIAGNOSIS — F17210 Nicotine dependence, cigarettes, uncomplicated: Secondary | ICD-10-CM | POA: Insufficient documentation

## 2017-05-31 DIAGNOSIS — I10 Essential (primary) hypertension: Secondary | ICD-10-CM | POA: Insufficient documentation

## 2017-05-31 LAB — COMPREHENSIVE METABOLIC PANEL
ALT: 29 U/L (ref 17–63)
AST: 30 U/L (ref 15–41)
Albumin: 4.5 g/dL (ref 3.5–5.0)
Alkaline Phosphatase: 46 U/L (ref 38–126)
Anion gap: 11 (ref 5–15)
BUN: 7 mg/dL (ref 6–20)
CHLORIDE: 106 mmol/L (ref 101–111)
CO2: 22 mmol/L (ref 22–32)
Calcium: 9.4 mg/dL (ref 8.9–10.3)
Creatinine, Ser: 1.2 mg/dL (ref 0.61–1.24)
Glucose, Bld: 101 mg/dL — ABNORMAL HIGH (ref 65–99)
POTASSIUM: 3.5 mmol/L (ref 3.5–5.1)
Sodium: 139 mmol/L (ref 135–145)
Total Bilirubin: 1.3 mg/dL — ABNORMAL HIGH (ref 0.3–1.2)
Total Protein: 7.6 g/dL (ref 6.5–8.1)

## 2017-05-31 LAB — RAPID URINE DRUG SCREEN, HOSP PERFORMED
AMPHETAMINES: NOT DETECTED
BARBITURATES: NOT DETECTED
BENZODIAZEPINES: NOT DETECTED
COCAINE: POSITIVE — AB
Opiates: NOT DETECTED
Tetrahydrocannabinol: POSITIVE — AB

## 2017-05-31 LAB — CBC
HEMATOCRIT: 44.8 % (ref 39.0–52.0)
Hemoglobin: 14.9 g/dL (ref 13.0–17.0)
MCH: 28.8 pg (ref 26.0–34.0)
MCHC: 33.3 g/dL (ref 30.0–36.0)
MCV: 86.7 fL (ref 78.0–100.0)
PLATELETS: 255 10*3/uL (ref 150–400)
RBC: 5.17 MIL/uL (ref 4.22–5.81)
RDW: 12.7 % (ref 11.5–15.5)
WBC: 5.1 10*3/uL (ref 4.0–10.5)

## 2017-05-31 MED ORDER — LORAZEPAM 0.5 MG PO TABS
0.5000 mg | ORAL_TABLET | Freq: Once | ORAL | Status: AC
  Administered 2017-05-31: 0.5 mg via ORAL
  Filled 2017-05-31: qty 1

## 2017-05-31 MED ORDER — SODIUM CHLORIDE 0.9 % IV BOLUS (SEPSIS)
1000.0000 mL | Freq: Once | INTRAVENOUS | Status: AC
  Administered 2017-05-31: 1000 mL via INTRAVENOUS
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
## 2017-05-31 NOTE — ED Provider Notes (Signed)
[facility]   CSN: [id number] Arrival date & time: 05/31/17  1140     History   Chief Complaint Chief Complaint  Patient presents with  . Anxiety/questionable ingestion    HPI Allen Henson is a 31 y.o. male.  Patients with history of panic disorder, chronic alcoholism -- presents with complaint of anxiety, dizziness, headache, nausea after being told that he ingested ecstasy this morning along with alcohol he was drinking. This was around 6AM. Patient states that he has had on and off anxiety after being told this. He has not vomited. He does state that he feels dehydrated and has not had anything substantial to drink other than alcohol since yesterday. Patient is almost a daily drinker. He states that he drank a cup of vodka this morning. He is not feeling he is withdrawing. Continues to have lightheadedness and nausea. Denies other medications or treatments. No chest pain or abdominal pain. No lower extremity swelling. Patient has been on Xanax and Ativan in the past for his anxiety.       Past Medical History:  Diagnosis Date  . Anxiety   . Anxiety   . Hypertension     Patient Active Problem List   Diagnosis Date Noted  . Homicidal ideation 04/15/2014    Past Surgical History:  Procedure Laterality Date  . NO PAST SURGERIES         Home Medications    Prior to Admission medications   Medication Sig Start Date End Date Taking? Authorizing Provider  albuterol (PROVENTIL HFA;VENTOLIN HFA) 108 (90 BASE) MCG/ACT inhaler Inhale 2 puffs into the lungs every 4 (four) hours as needed for wheezing or shortness of breath.   Yes [provider]  ibuprofen (ADVIL,MOTRIN) 800 MG tablet Take 400 mg by mouth daily as needed (pain).   Yes [provider]  LORazepam (ATIVAN) 1 MG tablet Take 1 tablet (1 mg total) by mouth 3 (three) times daily as needed for anxiety. Patient not taking: Reported on  05/31/2017 09/16/14   Mancel BaleWentz, Elliott, MD    Family History No family history on file.  Social History Social History  Substance Use Topics  . Smoking status: Current Every Day Smoker    Packs/day: 1.00    Types: Cigarettes    Last attempt to quit: 10/15/2013  . Smokeless tobacco: Never Used  . Alcohol use Yes     Comment: every weekend     Allergies   Latex   Review of Systems Review of Systems  Constitutional: Negative for fever.  HENT: Negative for rhinorrhea and sore throat.   Eyes: Negative for redness.  Respiratory: Positive for shortness of breath. Negative for cough.   Cardiovascular: Positive for palpitations. Negative for chest pain and leg swelling.  Gastrointestinal: Positive for nausea. Negative for abdominal pain, diarrhea and vomiting.  Genitourinary: Negative for dysuria.  Musculoskeletal: Negative for myalgias.  Skin: Negative for rash.  Neurological: Positive for light-headedness. Negative for syncope and headaches.  Psychiatric/Behavioral: The patient is nervous/anxious.      Physical Exam Updated Vital Signs BP (!) 124/105 (BP Location: Left Arm)   Pulse (!) 116   Temp 97.7 F (36.5 C) (Oral)   Resp 20   SpO2 98%   Physical Exam  Constitutional: He appears well-developed and well-nourished.  HENT:  Head: Normocephalic and atraumatic.  Mouth/Throat: Oropharynx is clear and moist.  Eyes: Conjunctivae are normal. Right eye exhibits no discharge. Left eye exhibits no discharge.  Neck: Normal range of motion. Neck supple.  Cardiovascular: Normal rate, regular rhythm and normal heart sounds.   Pulmonary/Chest: Effort normal and breath sounds normal. No respiratory distress. He has no wheezes. He has no rales.  Abdominal: Soft. There is no tenderness. There is no rebound and no guarding.  Musculoskeletal: He exhibits no edema.  Neurological: He is alert.  Skin: Skin is warm and dry.  Psychiatric: His mood appears anxious.  Nursing note and  vitals reviewed.    ED Treatments / Results  Labs (all labs ordered are listed, but only abnormal results are displayed) Labs Reviewed  COMPREHENSIVE METABOLIC PANEL - Abnormal; Notable for the following:       Result Value   Glucose, Bld 101 (*)    Total Bilirubin 1.3 (*)    All other components within normal limits  RAPID URINE DRUG SCREEN, HOSP PERFORMED - Abnormal; Notable for the following:    Cocaine POSITIVE (*)    Tetrahydrocannabinol POSITIVE (*)    All other components within normal limits  CBC  ETHANOL    EKG  EKG Interpretation  Date/Time:  Thursday May 31 2017 16:16:08 EDT Ventricular Rate:  75 PR Interval:    QRS Duration: 93 QT Interval:  379 QTC Calculation: 424 R Axis:   96 Text Interpretation:  Sinus rhythm Borderline prolonged PR interval Borderline right axis deviation No significant change since last tracing Confirmed by Frederick Peers [phone]) on 05/31/2017 4:51:30 PM       Radiology No results found.  Procedures Procedures (including critical care time)  Medications Ordered in ED Medications  sodium chloride 0.9 % bolus 1,000 mL (1,000 mLs Intravenous New Bag/Given 05/31/17 1636)  LORazepam (ATIVAN) tablet 0.5 mg (0.5 mg Oral Given 05/31/17 1612)  sodium chloride 0.9 % bolus 1,000 mL (0 mLs Intravenous Stopped 05/31/17 1811)     Initial Impression / Assessment and Plan / ED Course  I have reviewed the triage vital signs and the nursing notes.  Pertinent labs & imaging results that were available during my care of the patient were reviewed by me and considered in my medical decision making (see chart for details).     Patient seen and examined. Work-up initiated. Medications ordered. He appears well but anxious. He requests fluid bolus because he states that this made him feel better in the past. Will check orthostatics, EKG. Pending UDS and alcohol level. If negative and patient is tolerating oral fluids, feel that he can be  discharged home.  Vital signs reviewed and are as follows: BP (!) 124/105 (BP Location: Left Arm)   Pulse (!) 116   Temp 97.7 F (36.5 C) (Oral)   Resp 20   SpO2 98%   ED ECG REPORT   Date: 05/31/2017  Rate: 75  Rhythm: normal sinus rhythm  QRS Axis: normal  Intervals: normal  ST/T Wave abnormalities: normal  Conduction Disutrbances:none  Narrative Interpretation:   Old EKG Reviewed: changes noted, t-waves improved  I have personally reviewed the EKG tracing and agree with the computerized printout as noted.  6:36 PM Pt is doing better. Fluids almost completed.   Pt does admit to cocaine use.   Will d/c to home with rest. Encouraged decreasing/cessation of substance abuse.   Final Clinical Impressions(s) / ED Diagnoses   Final diagnoses:  Anxiety  Polysubstance abuse (HCC)   Patient with elevated heart rate and anxiety, palpitations after using alcohol, cocaine, possibly ecstasy. At time of exam, tachycardia resolved. EKG without prolonged QT, WPW, Brugada  syndrome, cardiomegaly, other arrhythmogenic findings. No anemia. Labs are reassuring. Patient feels better after IV hydration. No concern for PE, ACS. Patient without chest pains or other clinical signs of thrombus.  New Prescriptions Current Discharge Medication List       Renne Crigler, Cordelia Poche 05/31/17 1843    Little, Ambrose Finland, MD 06/10/17 1714

## 2017-05-31 NOTE — Discharge Instructions (Signed)
Please read and follow all provided instructions.  Your diagnoses today include:  1. Anxiety   2. Polysubstance abuse (HCC)    Tests performed today include:  Blood counts and electrolytes  Drug screen  EKG  Vital signs. See below for your results today.   Medications prescribed:   None  Take any prescribed medications only as directed.  Home care instructions:  Follow any educational materials contained in this packet.  Avoid alcohol and drugs.  Follow-up instructions: Please follow-up with your primary care provider in the next 3 days for further evaluation of your symptoms.   Return instructions:   Please return to the Emergency Department if you experience worsening symptoms.   Return with chest pain, shortness of breath, if you pass out.   Please return if you have any other emergent concerns.  Additional Information:  Your vital signs today were: BP 126/82    Pulse 78    Temp 97.7 F (36.5 C) (Oral)    Resp 14    SpO2 100%  If your blood pressure (BP) was elevated above 135/85 this visit, please have this repeated by your doctor within one month. --------------

## 2017-05-31 NOTE — ED Notes (Signed)
Pt aware of need for urine  

## 2017-05-31 NOTE — ED Triage Notes (Deleted)
To ED for eval of feeling anxious of the past few weeks. States he feels an overwhelming out of body feeling intermittently and no reason for it to come. States he has been treated for anxiety approx a year ago. Pt appears very calm and interacting well with staff. Denies SI or HI. No difficulty with sleeping or eating. Today pt was playing basketball as he normally does and felt this come on accompanied by double vision for a few minutes.  

## 2018-10-03 ENCOUNTER — Encounter (HOSPITAL_COMMUNITY): Payer: Self-pay | Admitting: Emergency Medicine

## 2018-10-03 ENCOUNTER — Ambulatory Visit (HOSPITAL_COMMUNITY)
Admission: EM | Admit: 2018-10-03 | Discharge: 2018-10-03 | Disposition: A | Discharge status: Home / Self Care | Payer: Self-pay | Attending: Family Medicine | Admitting: Family Medicine

## 2018-10-03 DIAGNOSIS — R69 Illness, unspecified: Secondary | ICD-10-CM

## 2018-10-03 DIAGNOSIS — J111 Influenza due to unidentified influenza virus with other respiratory manifestations: Secondary | ICD-10-CM

## 2018-10-03 MED ORDER — IBUPROFEN 800 MG PO TABS: 800.0000 mg | ORAL_TABLET | Freq: Three times a day (TID) | ORAL | 0 refills | Status: DC

## 2018-10-03 MED ORDER — PSEUDOEPH-BROMPHEN-DM 30-2-10 MG/5ML PO SYRP: 5.0000 mL | ORAL_SOLUTION | Freq: Four times a day (QID) | ORAL | 0 refills | Status: DC | PRN

## 2018-10-03 MED ORDER — ACETAMINOPHEN 325 MG PO TABS
650.0000 mg | ORAL_TABLET | Freq: Once | ORAL | Status: AC
  Administered 2018-10-03: 650 mg via ORAL

## 2018-10-03 MED ORDER — CETIRIZINE HCL 10 MG PO CAPS: 10.0000 mg | ORAL_CAPSULE | Freq: Every day | ORAL | 0 refills | Status: DC

## 2018-10-03 MED ORDER — ACETAMINOPHEN 325 MG PO TABS
ORAL_TABLET | ORAL | Status: AC
  Filled 2018-10-03: qty 2

## 2018-10-03 NOTE — ED Provider Notes (Signed)
[facility]    CSN: [id number] Arrival date & time: 10/03/18  1443     History   Chief Complaint Chief Complaint  Patient presents with  . URI    HPI Allen Henson is a 33 y.o. male history of anxiety presenting today for evaluation of URI symptoms and fever.  Patient states that over the past 3 days he has had fevers, body aches, headaches, cough and congestion.  He is overall felt drained, has had poor appetite.  Denies nausea vomiting or diarrhea.  He started to feel dehydrated.  He also notes he has had some sneezing.  He has tried Mucinex and TheraFlu without relief.  Denies any travel or exposure to anyone concerned for coronavirus recently.  Denies chest pain or shortness of breath. HPI  Past Medical History:  Diagnosis Date  . Anxiety   . Anxiety   . Hypertension     Patient Active Problem List   Diagnosis Date Noted  . Homicidal ideation 04/15/2014    Past Surgical History:  Procedure Laterality Date  . NO PAST SURGERIES         Home Medications    Prior to Admission medications   Medication Sig Start Date End Date Taking? Authorizing Provider  albuterol (PROVENTIL HFA;VENTOLIN HFA) 108 (90 BASE) MCG/ACT inhaler Inhale 2 puffs into the lungs every 4 (four) hours as needed for wheezing or shortness of breath.    [provider]  brompheniramine-pseudoephedrine-DM 30-2-10 MG/5ML syrup Take 5 mLs by mouth 4 (four) times daily as needed. 10/03/18   Glennie Rodda C, PA-C  Cetirizine HCl 10 MG CAPS Take 1 capsule (10 mg total) by mouth daily for 10 days. 10/03/18 10/13/18  Milas Schappell C, PA-C  ibuprofen (ADVIL,MOTRIN) 800 MG tablet Take 1 tablet (800 mg total) by mouth 3 (three) times daily. 10/03/18   Dayne Dekay, Junius Creamer, PA-C    Family History History reviewed. No pertinent family history.  Social History Social History   Tobacco Use  . Smoking status: Current Every Day Smoker    Packs/day: 1.00    Types: Cigarettes    Last attempt  to quit: 10/15/2013    Years since quitting: 4.9  . Smokeless tobacco: Never Used  Substance Use Topics  . Alcohol use: Yes    Comment: every weekend  . Drug use: Yes    Types: Marijuana     Allergies   Latex   Review of Systems Review of Systems  Constitutional: Positive for appetite change, chills, fatigue and fever. Negative for activity change.  HENT: Positive for congestion and rhinorrhea. Negative for ear pain, sinus pressure, sore throat and trouble swallowing.   Eyes: Negative for discharge and redness.  Respiratory: Positive for cough. Negative for chest tightness and shortness of breath.   Cardiovascular: Negative for chest pain.  Gastrointestinal: Negative for abdominal pain, diarrhea, nausea and vomiting.  Musculoskeletal: Positive for arthralgias and myalgias.  Skin: Negative for rash.  Neurological: Positive for headaches. Negative for dizziness and light-headedness.     Physical Exam Triage Vital Signs ED Triage Vitals [10/03/18 1554]  Enc Vitals Group     BP 123/79     Pulse Rate (!) 115     Resp 16     Temp (!) 101.9 F (38.8 C)     Temp Source Oral     SpO2 96 %     Weight      Height      Head Circumference  Peak Flow      Pain Score 10     Pain Loc      Pain Edu?      Excl. in GC?    No data found.  Updated Vital Signs BP 123/79 (BP Location: Right Arm)   Pulse (!) 115   Temp (!) 101.9 F (38.8 C) (Oral)   Resp 16   SpO2 96%   Visual Acuity Right Eye Distance:   Left Eye Distance:   Bilateral Distance:    Right Eye Near:   Left Eye Near:    Bilateral Near:     Physical Exam Vitals signs and nursing note reviewed.  Constitutional:      Appearance: He is well-developed.  HENT:     Head: Normocephalic and atraumatic.     Ears:     Comments: Bilateral ears without tenderness to palpation of external auricle, tragus and mastoid, EAC's without erythema or swelling, TM's with good bony landmarks and cone of light. Non  erythematous.    Nose:     Comments: Mildly swollen turbinates with erythema    Mouth/Throat:     Comments: Oral mucosa pink and moist, no tonsillar enlargement or exudate. Posterior pharynx patent and nonerythematous, no uvula deviation or swelling. Normal phonation.  Eyes:     Conjunctiva/sclera: Conjunctivae normal.  Neck:     Musculoskeletal: Neck supple.  Cardiovascular:     Rate and Rhythm: Regular rhythm. Tachycardia present.     Heart sounds: No murmur.  Pulmonary:     Effort: Pulmonary effort is normal. No respiratory distress.     Breath sounds: Normal breath sounds.     Comments: Breathing comfortably at rest, CTABL, no wheezing, rales or other adventitious sounds auscultated Abdominal:     Palpations: Abdomen is soft.     Tenderness: There is no abdominal tenderness.  Skin:    General: Skin is warm and dry.  Neurological:     Mental Status: He is alert.      UC Treatments / Results  Labs (all labs ordered are listed, but only abnormal results are displayed) Labs Reviewed - No data to display  EKG None  Radiology No results found.  Procedures Procedures (including critical care time)  Medications Ordered in UC Medications  acetaminophen (TYLENOL) tablet 650 mg (has no administration in time range)    Initial Impression / Assessment and Plan / UC Course  I have reviewed the triage vital signs and the nursing notes.  Pertinent labs & imaging results that were available during my care of the patient were reviewed by me and considered in my medical decision making (see chart for details).     Acute onset of URI symptoms with fever, most likely influenza-like illness, lungs clear, do not suspect underlying pneumonia at this time.  Outside of Tamiflu window, will recommend continued symptomatic and supportive care, will continue to monitor breathing, temperature and symptoms, follow-up if symptoms not resolving as would expected with flu or symptoms  significantly worsening, fever persisting.Discussed strict return precautions. Patient verbalized understanding and is agreeable with plan.  Final Clinical Impressions(s) / UC Diagnoses   Final diagnoses:  Influenza-like illness     Discharge Instructions     Your symptoms are most likely from the flu, this is a virus that runs its course over approximately 1 week.  Please use recommendations below to further manage her symptoms.  Follow-up if symptoms not having any improvement or fever persisting towards in the weekend.  1. Take  a daily allergy pill/anti-histamine like Zyrtec, Claritin, or Store brand consistently for 2 weeks  2. For congestion you may try an oral decongestant like Mucinex or sudafed. You may also try intranasal flonase nasal spray or saline irrigations (neti pot, sinus cleanse)  3. For your sore throat you may try cepacol lozenges, salt water gargles, throat spray. Treatment of congestion may also help your sore throat.  4. For cough you may try cough syrup provided or over-the-counter Delsym, Robitussen, Mucinex DM  5. Take Tylenol or Ibuprofen to help with pain/inflammation-alternate every 4 hours for better management of fever and body aches  6. Stay hydrated, drink plenty of fluids to keep throat coated and less irritated  Honey Tea For cough/sore throat try using a honey-based tea. Use 3 teaspoons of honey with juice squeezed from half lemon. Place shaved pieces of ginger into 1/2-1 cup of water and warm over stove top. Then mix the ingredients and repeat every 4 hours as needed.    ED Prescriptions    Medication Sig Dispense Auth. Provider   ibuprofen (ADVIL,MOTRIN) 800 MG tablet Take 1 tablet (800 mg total) by mouth 3 (three) times daily. 21 tablet Jadie Allington C, PA-C   brompheniramine-pseudoephedrine-DM 30-2-10 MG/5ML syrup Take 5 mLs by mouth 4 (four) times daily as needed. 120 mL Detron Carras C, PA-C   Cetirizine HCl 10 MG CAPS Take 1 capsule (10  mg total) by mouth daily for 10 days. 10 capsule Tien Spooner C, PA-C     Controlled Substance Prescriptions [place] Controlled Substance Registry consulted? Not Applicable   Lew Dawes, [place] 10/03/18 1629

## 2018-10-03 NOTE — Discharge Instructions (Signed)
Your symptoms are most likely from the flu, this is a virus that runs its course over approximately 1 week.  Please use recommendations below to further manage her symptoms.  Follow-up if symptoms not having any improvement or fever persisting towards in the weekend.  1. Take a daily allergy pill/anti-histamine like Zyrtec, Claritin, or Store brand consistently for 2 weeks  2. For congestion you may try an oral decongestant like Mucinex or sudafed. You may also try intranasal flonase nasal spray or saline irrigations (neti pot, sinus cleanse)  3. For your sore throat you may try cepacol lozenges, salt water gargles, throat spray. Treatment of congestion may also help your sore throat.  4. For cough you may try cough syrup provided or over-the-counter Delsym, Robitussen, Mucinex DM  5. Take Tylenol or Ibuprofen to help with pain/inflammation-alternate every 4 hours for better management of fever and body aches  6. Stay hydrated, drink plenty of fluids to keep throat coated and less irritated  Honey Tea For cough/sore throat try using a honey-based tea. Use 3 teaspoons of honey with juice squeezed from half lemon. Place shaved pieces of ginger into 1/2-1 cup of water and warm over stove top. Then mix the ingredients and repeat every 4 hours as needed.

## 2018-10-03 NOTE — ED Triage Notes (Signed)
PT C/O: cold sx onset 3 days associated w/weakness at BLE, fevers, body aches, cough, nasal drainage  Sts legs feels like "noodles"  DENIES: v/d  TAKING MEDS: Mucinex  A&O x4... NAD... Ambulatory

## 2019-05-15 ENCOUNTER — Other Ambulatory Visit: Payer: Self-pay

## 2019-05-15 ENCOUNTER — Ambulatory Visit (HOSPITAL_COMMUNITY)
Admission: EM | Admit: 2019-05-15 | Discharge: 2019-05-15 | Disposition: A | Discharge status: Home / Self Care | Payer: Self-pay | Attending: Family Medicine | Admitting: Family Medicine

## 2019-05-15 ENCOUNTER — Encounter (HOSPITAL_COMMUNITY): Payer: Self-pay | Admitting: Family Medicine

## 2019-05-15 DIAGNOSIS — L03312 Cellulitis of back [any part except buttock]: Secondary | ICD-10-CM | POA: Insufficient documentation

## 2019-05-15 LAB — CBC WITH DIFFERENTIAL/PLATELET
Abs Immature Granulocytes: 0.09 10*3/uL — ABNORMAL HIGH (ref 0.00–0.07)
Basophils Absolute: 0 10*3/uL (ref 0.0–0.1)
Basophils Relative: 0 %
Eosinophils Absolute: 0.1 10*3/uL (ref 0.0–0.5)
Eosinophils Relative: 1 %
HCT: 43.9 % (ref 39.0–52.0)
Hemoglobin: 14.2 g/dL (ref 13.0–17.0)
Immature Granulocytes: 1 %
Lymphocytes Relative: 15 %
Lymphs Abs: 1.5 10*3/uL (ref 0.7–4.0)
MCH: 29 pg (ref 26.0–34.0)
MCHC: 32.3 g/dL (ref 30.0–36.0)
MCV: 89.8 fL (ref 80.0–100.0)
Monocytes Absolute: 1.1 10*3/uL — ABNORMAL HIGH (ref 0.1–1.0)
Monocytes Relative: 11 %
Neutro Abs: 6.9 10*3/uL (ref 1.7–7.7)
Neutrophils Relative %: 72 %
Platelets: 233 10*3/uL (ref 150–400)
RBC: 4.89 MIL/uL (ref 4.22–5.81)
RDW: 12.9 % (ref 11.5–15.5)
WBC: 9.7 10*3/uL (ref 4.0–10.5)
nRBC: 0 % (ref 0.0–0.2)

## 2019-05-15 MED ORDER — CEFTRIAXONE SODIUM 1 G IJ SOLR: 1.0000 g | Freq: Once | INTRAMUSCULAR | Status: DC

## 2019-05-15 MED ORDER — DOXYCYCLINE HYCLATE 100 MG PO CAPS: 100.0000 mg | ORAL_CAPSULE | Freq: Two times a day (BID) | ORAL | 0 refills | Status: DC

## 2019-05-15 MED ORDER — ACETAMINOPHEN 325 MG PO TABS: 975.0000 mg | ORAL_TABLET | Freq: Once | ORAL | Status: DC

## 2019-05-15 MED ORDER — HYDROCODONE-ACETAMINOPHEN 5-325 MG PO TABS: 1.0000 | ORAL_TABLET | Freq: Four times a day (QID) | ORAL | 0 refills | Status: DC | PRN

## 2019-05-15 MED ORDER — CEFTRIAXONE SODIUM 1 G IJ SOLR
INTRAMUSCULAR | Status: AC
  Filled 2019-05-15: qty 10

## 2019-05-15 MED ORDER — KETOROLAC TROMETHAMINE 30 MG/ML IJ SOLN: 30.0000 mg | Freq: Once | INTRAMUSCULAR | Status: DC

## 2019-05-15 MED ORDER — KETOROLAC TROMETHAMINE 30 MG/ML IJ SOLN
INTRAMUSCULAR | Status: AC
  Filled 2019-05-15: qty 1

## 2019-05-15 MED ORDER — ACETAMINOPHEN 325 MG PO TABS
ORAL_TABLET | ORAL | Status: AC
  Filled 2019-05-15: qty 3

## 2019-05-15 NOTE — ED Triage Notes (Signed)
Pt thinks he was bitten by a spider 3 days ago. ( It's on  his right side near his right armpit.) It's swollen and red.

## 2019-05-15 NOTE — ED Provider Notes (Signed)
[facility]    CSN: [id number] Arrival date & time: 05/15/19  0539      History   Chief Complaint Chief Complaint  Patient presents with  . Insect Bite    HPI Allen Henson is a 33 y.o. male.   Patient is a 33 year old male with past medical history of anxiety, hypertension.  He presents today with swelling, pain and redness to the right upper back/flank area.  This is been constant and worsening over the past 3 days.  Reporting that his mother tried to squeeze the area last night to get drainage out and there was a very scant amount of drainage.  Reporting overall just not feeling well.  He is febrile here today at 100.2.  Mild headache.  Denies any nausea, vomiting.  Denies any history of same or MRSA.  ROS per HPI      Past Medical History:  Diagnosis Date  . Anxiety   . Anxiety   . Hypertension     Patient Active Problem List   Diagnosis Date Noted  . Homicidal ideation 04/15/2014    Past Surgical History:  Procedure Laterality Date  . NO PAST SURGERIES         Home Medications    Prior to Admission medications   Medication Sig Start Date End Date Taking? Authorizing Provider  albuterol (PROVENTIL HFA;VENTOLIN HFA) 108 (90 BASE) MCG/ACT inhaler Inhale 2 puffs into the lungs every 4 (four) hours as needed for wheezing or shortness of breath.    [provider]  brompheniramine-pseudoephedrine-DM 30-2-10 MG/5ML syrup Take 5 mLs by mouth 4 (four) times daily as needed. 10/03/18   Wieters, Hallie C, PA-C  Cetirizine HCl 10 MG CAPS Take 1 capsule (10 mg total) by mouth daily for 10 days. 10/03/18 10/13/18  Wieters, Hallie C, PA-C  doxycycline (VIBRAMYCIN) 100 MG capsule Take 1 capsule (100 mg total) by mouth 2 (two) times daily. 05/15/19   Dahlia Byes A, NP  HYDROcodone-acetaminophen (NORCO/VICODIN) 5-325 MG tablet Take 1-2 tablets by mouth every 6 (six) hours as needed. 05/15/19   Dahlia Byes A, NP  ibuprofen (ADVIL,MOTRIN) 800 MG tablet Take 1  tablet (800 mg total) by mouth 3 (three) times daily. 10/03/18   Wieters, Junius Creamer, PA-C    Family History History reviewed. No pertinent family history.  Social History Social History   Tobacco Use  . Smoking status: Current Every Day Smoker    Packs/day: 1.00    Types: Cigarettes    Last attempt to quit: 10/15/2013    Years since quitting: 5.5  . Smokeless tobacco: Never Used  Substance Use Topics  . Alcohol use: Yes    Comment: every weekend  . Drug use: Yes    Types: Marijuana     Allergies   Latex   Review of Systems Review of Systems   Physical Exam Triage Vital Signs ED Triage Vitals  Enc Vitals Group     BP 05/15/19 0910 (!) 134/94     Pulse Rate 05/15/19 0910 99     Resp 05/15/19 0910 18     Temp 05/15/19 0910 100.2 F (37.9 C)     Temp Source 05/15/19 0910 Oral     SpO2 05/15/19 0910 99 %     Weight 05/15/19 0908 200 lb (90.7 kg)     Height --      Head Circumference --      Peak Flow --      Pain Score 05/15/19 0908 10  Pain Loc --      Pain Edu? --      Excl. in GC? --    No data found.  Updated Vital Signs BP (!) 134/94 (BP Location: Right Arm)   Pulse 99   Temp 100.2 F (37.9 C) (Oral)   Resp 18   Wt 200 lb (90.7 kg)   SpO2 99%   BMI 29.53 kg/m   Visual Acuity Right Eye Distance:   Left Eye Distance:   Bilateral Distance:    Right Eye Near:   Left Eye Near:    Bilateral Near:     Physical Exam Vitals signs and nursing note reviewed.  Constitutional:      Appearance: Normal appearance.  HENT:     Head: Normocephalic and atraumatic.     Nose: Nose normal.  Eyes:     Conjunctiva/sclera: Conjunctivae normal.  Neck:     Musculoskeletal: Normal range of motion.  Pulmonary:     Effort: Pulmonary effort is normal.  Musculoskeletal: Normal range of motion.     Thoracic back: He exhibits tenderness and swelling.       Back:     Comments: Large area of swelling and induration. No fluctuance.   Skin:    General: Skin is  warm and dry.  Neurological:     Mental Status: He is alert.  Psychiatric:        Mood and Affect: Mood normal.          UC Treatments / Results  Labs (all labs ordered are listed, but only abnormal results are displayed) Labs Reviewed  CBC WITH DIFFERENTIAL/PLATELET - Abnormal; Notable for the following components:      Result Value   Monocytes Absolute 1.1 (*)    Abs Immature Granulocytes 0.09 (*)    All other components within normal limits    EKG   Radiology No results found.  Procedures Procedures (including critical care time)  Medications Ordered in UC Medications  cefTRIAXone (ROCEPHIN) injection 1 g (has no administration in time range)  acetaminophen (TYLENOL) tablet 975 mg (has no administration in time range)  ketorolac (TORADOL) 30 MG/ML injection 30 mg (has no administration in time range)  ketorolac (TORADOL) 30 MG/ML injection (has no administration in time range)  acetaminophen (TYLENOL) 325 MG tablet (has no administration in time range)  cefTRIAXone (ROCEPHIN) 1 g injection (has no administration in time range)    Initial Impression / Assessment and Plan / UC Course  I have reviewed the triage vital signs and the nursing notes.  Pertinent labs & imaging results that were available during my care of the patient were reviewed by me and considered in my medical decision making (see chart for details).    Cellulitis-  Patient is a 33 year old male who presents today with cellulitis to the right upper back/flank area. Treating this in clinic today with Rocephin injection and Toradol for pain.  Tylenol for fever Sending doxycycline to the pharmacy to further treat the infection.  Hydrocodone for pain Nothing concerning on CBC We will have him come back tomorrow for recheck to ensure that the area is not becoming larger Patient understanding and agree. Strict return precautions given long with ER precautions Final Clinical Impressions(s) / UC  Diagnoses   Final diagnoses:  Cellulitis of back except buttock     Discharge Instructions     Treating you for infection.  Antibiotic injection given here in clinic along with Tylenol for fever.  Toradol injection given here for  pain. I am drawing some blood work to ensure that your blood levels are normal. You need to keep a close watch on this.  I would like for you to come back tomorrow for a recheck Sending some antibiotics and pain medicine to the pharmacy. If you become very sick overnight with high fevers you need to go to the hospital    ED Prescriptions    Medication Sig Dispense Auth. Provider   doxycycline (VIBRAMYCIN) 100 MG capsule  (Status: Discontinued) Take 1 capsule (100 mg total) by mouth 2 (two) times daily. 20 capsule Brithney Bensen A, NP   doxycycline (VIBRAMYCIN) 100 MG capsule Take 1 capsule (100 mg total) by mouth 2 (two) times daily. 20 capsule Eiliana Drone A, NP   HYDROcodone-acetaminophen (NORCO/VICODIN) 5-325 MG tablet Take 1-2 tablets by mouth every 6 (six) hours as needed. 10 tablet Zadia Uhde A, NP     I have reviewed the PDMP during this encounter.   Loura Halt A, NP 05/15/19 1020

## 2019-05-15 NOTE — Discharge Instructions (Addendum)
Treating you for infection.  Antibiotic injection given here in clinic along with Tylenol for fever.  Toradol injection given here for pain. I am drawing some blood work to ensure that your blood levels are normal. You need to keep a close watch on this.  I would like for you to come back tomorrow for a recheck Sending some antibiotics and pain medicine to the pharmacy. If you become very sick overnight with high fevers you need to go to the hospital

## 2019-05-16 ENCOUNTER — Telehealth (HOSPITAL_COMMUNITY): Payer: Self-pay | Admitting: Emergency Medicine

## 2019-05-16 NOTE — Telephone Encounter (Signed)
Labs unremarkable, attempted to call patient to see if he would return for recheck, no answer.

## 2021-11-11 ENCOUNTER — Encounter (HOSPITAL_COMMUNITY): Payer: Self-pay | Admitting: Emergency Medicine

## 2021-11-11 ENCOUNTER — Emergency Department (HOSPITAL_COMMUNITY)
Admission: EM | Admit: 2021-11-11 | Discharge: 2021-11-12 | Disposition: A | Discharge status: Other Acute Inpatient Hospital | Payer: Self-pay | Attending: Emergency Medicine | Admitting: Emergency Medicine

## 2021-11-11 ENCOUNTER — Other Ambulatory Visit: Payer: Self-pay

## 2021-11-11 DIAGNOSIS — F1014 Alcohol abuse with alcohol-induced mood disorder: Secondary | ICD-10-CM | POA: Diagnosis present

## 2021-11-11 DIAGNOSIS — F1914 Other psychoactive substance abuse with psychoactive substance-induced mood disorder: Secondary | ICD-10-CM | POA: Insufficient documentation

## 2021-11-11 DIAGNOSIS — F1490 Cocaine use, unspecified, uncomplicated: Secondary | ICD-10-CM | POA: Diagnosis present

## 2021-11-11 DIAGNOSIS — I10 Essential (primary) hypertension: Secondary | ICD-10-CM | POA: Insufficient documentation

## 2021-11-11 DIAGNOSIS — W228XXA Striking against or struck by other objects, initial encounter: Secondary | ICD-10-CM | POA: Insufficient documentation

## 2021-11-11 DIAGNOSIS — Z046 Encounter for general psychiatric examination, requested by authority: Secondary | ICD-10-CM | POA: Insufficient documentation

## 2021-11-11 DIAGNOSIS — Z20822 Contact with and (suspected) exposure to covid-19: Secondary | ICD-10-CM | POA: Insufficient documentation

## 2021-11-11 DIAGNOSIS — Y907 Blood alcohol level of 200-239 mg/100 ml: Secondary | ICD-10-CM | POA: Insufficient documentation

## 2021-11-11 DIAGNOSIS — Z91040 Latex allergy status: Secondary | ICD-10-CM | POA: Insufficient documentation

## 2021-11-11 DIAGNOSIS — Z79899 Other long term (current) drug therapy: Secondary | ICD-10-CM | POA: Insufficient documentation

## 2021-11-11 DIAGNOSIS — S50811A Abrasion of right forearm, initial encounter: Secondary | ICD-10-CM | POA: Insufficient documentation

## 2021-11-11 DIAGNOSIS — F1994 Other psychoactive substance use, unspecified with psychoactive substance-induced mood disorder: Secondary | ICD-10-CM | POA: Diagnosis present

## 2021-11-11 LAB — COMPREHENSIVE METABOLIC PANEL
ALT: 41 U/L (ref 0–44)
AST: 27 U/L (ref 15–41)
Albumin: 4.3 g/dL (ref 3.5–5.0)
Alkaline Phosphatase: 41 U/L (ref 38–126)
Anion gap: 12 (ref 5–15)
BUN: 6 mg/dL (ref 6–20)
CO2: 25 mmol/L (ref 22–32)
Calcium: 9.1 mg/dL (ref 8.9–10.3)
Chloride: 105 mmol/L (ref 98–111)
Creatinine, Ser: 1.19 mg/dL (ref 0.61–1.24)
GFR, Estimated: >60 mL/min (ref >60)
Glucose, Bld: 106 mg/dL — ABNORMAL HIGH (ref 70–99)
Potassium: 4.2 mmol/L (ref 3.5–5.1)
Sodium: 142 mmol/L (ref 135–145)
Total Bilirubin: 1 mg/dL (ref 0.3–1.2)
Total Protein: 7.4 g/dL (ref 6.5–8.1)

## 2021-11-11 LAB — CBC
HCT: 48.5 % (ref 39.0–52.0)
Hemoglobin: 15.5 g/dL (ref 13.0–17.0)
MCH: 28.5 pg (ref 26.0–34.0)
MCHC: 32 g/dL (ref 30.0–36.0)
MCV: 89.2 fL (ref 80.0–100.0)
Platelets: 309 10*3/uL (ref 150–400)
RBC: 5.44 MIL/uL (ref 4.22–5.81)
RDW: 13.6 % (ref 11.5–15.5)
WBC: 7 10*3/uL (ref 4.0–10.5)
nRBC: 0 % (ref 0.0–0.2)

## 2021-11-11 LAB — ACETAMINOPHEN LEVEL: Acetaminophen (Tylenol), Serum: <10 ug/mL — ABNORMAL LOW (ref 10–30)

## 2021-11-11 LAB — RAPID URINE DRUG SCREEN, HOSP PERFORMED
Amphetamines: NOT DETECTED
Barbiturates: NOT DETECTED
Benzodiazepines: NOT DETECTED
Cocaine: POSITIVE — AB
Opiates: NOT DETECTED
Tetrahydrocannabinol: POSITIVE — AB

## 2021-11-11 LAB — RESP PANEL BY RT-PCR (FLU A&B, COVID) ARPGX2
Influenza A by PCR: NEGATIVE
Influenza B by PCR: NEGATIVE
SARS Coronavirus 2 by RT PCR: NEGATIVE

## 2021-11-11 LAB — ETHANOL: Alcohol, Ethyl (B): 229 mg/dL — ABNORMAL HIGH (ref <10)

## 2021-11-11 LAB — SALICYLATE LEVEL: Salicylate Lvl: <7 mg/dL — ABNORMAL LOW (ref 7.0–30.0)

## 2021-11-11 NOTE — ED Provider Notes (Signed)
?[facility] EMERGENCY DEPARTMENT ?Provider Note ? ? ?CSN: [id number] ?Arrival date & time: 11/11/21  2141 ? ?  ? ?History ? ?Chief Complaint  ?Patient presents with  ? Psychiatric Evaluation  ? ? ?AL BRACEWELL is a 36 y.o. male. ? ?The history is provided by the patient and medical records.  ? ?35 year old male with history of anxiety, hypertension, polysubstance abuse, presenting to the ED under IVC petition by his girlfriend.  Per paperwork, he has been abusing cocaine, Molly, and alcohol.  He is threatening to shoot himself and self mutilating. ? ?Patient adamantly denies these claims, states "it's all a lie, they woke me up out of my sleep tonight with those papers".  He denies SI/HI/AVH.  He is  ? ?Home Medications ?Prior to Admission medications   ?Medication Sig Start Date End Date Taking? Authorizing Provider  ?albuterol (PROVENTIL HFA;VENTOLIN HFA) 108 (90 BASE) MCG/ACT inhaler Inhale 2 puffs into the lungs every 4 (four) hours as needed for wheezing or shortness of breath.    [provider]  ?brompheniramine-pseudoephedrine-DM 30-2-10 MG/5ML syrup Take 5 mLs by mouth 4 (four) times daily as needed. 10/03/18   Wieters, Junius Creamer, PA-C  ?Cetirizine HCl 10 MG CAPS Take 1 capsule (10 mg total) by mouth daily for 10 days. 10/03/18 10/13/18  Wieters, Hallie C, PA-C  ?doxycycline (VIBRAMYCIN) 100 MG capsule Take 1 capsule (100 mg total) by mouth 2 (two) times daily. 05/15/19   Dahlia Byes A, NP  ?HYDROcodone-acetaminophen (NORCO/VICODIN) 5-325 MG tablet Take 1-2 tablets by mouth every 6 (six) hours as needed. 05/15/19   Dahlia Byes A, NP  ?ibuprofen (ADVIL,MOTRIN) 800 MG tablet Take 1 tablet (800 mg total) by mouth 3 (three) times daily. 10/03/18   Wieters, Hallie C, PA-C  ?   ? ?Allergies    ?Latex   ? ?Review of Systems   ?Review of Systems  ?Psychiatric/Behavioral:    ?     IVC  ?All other systems reviewed and are negative. ? ?Physical Exam ?Updated Vital Signs ?BP 116/84 (BP Location: Left  Arm)   Pulse 87   Temp 98.1 ?F (36.7 ?C) (Oral)   Resp 17   Ht 5\' 9"  (1.753 m)   Wt 90.7 kg   SpO2 99%   BMI 29.53 kg/m?  ? ?Physical Exam ?Vitals and nursing note reviewed.  ?Constitutional:   ?   Appearance: He is well-developed.  ?HENT:  ?   Head: Normocephalic and atraumatic.  ?Eyes:  ?   Conjunctiva/sclera: Conjunctivae normal.  ?   Pupils: Pupils are equal, round, and reactive to light.  ?Cardiovascular:  ?   Rate and Rhythm: Normal rate and regular rhythm.  ?   Heart sounds: Normal heart sounds.  ?Pulmonary:  ?   Effort: Pulmonary effort is normal.  ?   Breath sounds: Normal breath sounds.  ?Abdominal:  ?   General: Bowel sounds are normal.  ?   Palpations: Abdomen is soft.  ?Musculoskeletal:     ?   General: Normal range of motion.  ?   Cervical back: Normal range of motion.  ?   Comments: Small abrasion noted to right dorsal forearm  ?Skin: ?   General: Skin is warm and dry.  ?Neurological:  ?   Mental Status: He is alert and oriented to person, place, and time.  ?Psychiatric:  ?   Comments: Seems agitated but cooperative with exam ?Denies SI/HI/AVH  ? ? ?ED Results / Procedures / Treatments   ?Labs ?(  all labs ordered are listed, but only abnormal results are displayed) ?Labs Reviewed  ?COMPREHENSIVE METABOLIC PANEL - Abnormal; Notable for the following components:  ?    Result Value  ? Glucose, Bld 106 (*)   ? All other components within normal limits  ?ETHANOL - Abnormal; Notable for the following components:  ? Alcohol, Ethyl (B) 229 (*)   ? All other components within normal limits  ?SALICYLATE LEVEL - Abnormal; Notable for the following components:  ? Salicylate Lvl <7.0 (*)   ? All other components within normal limits  ?ACETAMINOPHEN LEVEL - Abnormal; Notable for the following components:  ? Acetaminophen (Tylenol), Serum <10 (*)   ? All other components within normal limits  ?RAPID URINE DRUG SCREEN, HOSP PERFORMED - Abnormal; Notable for the following components:  ? Cocaine POSITIVE (*)   ?  Tetrahydrocannabinol POSITIVE (*)   ? All other components within normal limits  ?RESP PANEL BY RT-PCR (FLU A&B, COVID) ARPGX2  ?CBC  ? ? ?EKG ?None ? ?Radiology ?No results found. ? ?Procedures ?Procedures  ? ? ?Medications Ordered in ED ?Medications - No data to display ? ?ED Course/ Medical Decision Making/ A&P ?  ?                        ?Medical Decision Making ?Amount and/or Complexity of Data Reviewed ?Labs: ordered. ? ? ?36 year old male presenting to the ED under IVC petition by his girlfriend.  Reportedly voiced that he wanted to shoot himself, has been abusing Molly, cocaine, and alcohol.  Patient adamantly denies these claims, states he is fine.  He denies any SI/HI/AVH.  He seems agitated about being in the hospital on my exam but is cooperative.  He denies any physical complaints.  He was prescribed anxiety and hypertension medication, but has not taken them in approximately 8 years.  Labs as above-- ethanol 229, UDS + for cocaine and THC.  Medically cleared.  Will get TTS evaluation. ? ?TTS has evaluated, recommends overnight observation and reassessment by psychiatry in the AM.  Holdings orders in place.  First exam completed. ? ?Final Clinical Impression(s) / ED Diagnoses ?Final diagnoses:  ?Involuntary commitment  ? ? ?Rx / DC Orders ?ED Discharge Orders   ? ? None  ? ?  ? ? ?  ?Garlon Hatchet, PA-C ?11/12/21 6195 ? ?  ?Maia Plan, MD ?11/16/21 2354 ? ?

## 2021-11-11 NOTE — ED Provider Triage Note (Signed)
Emergency Medicine Provider Triage Evaluation Note ? ?Allen Henson , a 36 y.o. male  was evaluated in triage.  Pt complains of EMS.  Per police the response that he wanted to kill himself, shoot himself, he reports that he feels like he is the devil himself and is hitting himself.  Family reports that he does [place] and cocaine.  Small amount of cocaine obtained by the police. The patient is uncooperative and reports that there is nothing wrong with him. IVC paperwork filled out by patient.  ? ?Review of Systems  ?Positive: Suicidal ideations ?Negative: Homicidal ideations ? ?Physical Exam  ?Ht 5\' 9"  (1.753 m)   Wt 90.7 kg   BMI 29.53 kg/m?  ?Gen:   Awake, no distress   ?Resp:  Normal effort  ?MSK:   Moves extremities without difficulty  ?Other:   ? ?Medical Decision Making  ?Medically screening exam initiated at 9:55 PM.  Appropriate orders placed.  was informed that the remainder of the evaluation will be completed by another provider, this initial triage assessment does not replace that evaluation, and the importance of remaining in the ED until their evaluation is complete. ? ?Patient was admitted in 2015 for homicidal ideations 2018 for anxiety.  No other recent psych admissions with [facility] or epic seen.  Will order medical clearance. ?  ?2019, PA-C ?11/11/21 2201 ? ?

## 2021-11-11 NOTE — BH Assessment (Signed)
Clinician messaged Allen Henson. [place], RN: "Hey. It's Allen Henson with TTS. Is the pt able to engage in the assessment, if so the pt will need to be placed in a private room. Also is the pt under IVC?" ? ?Clinician awaiting response.  ? ? ?Redmond Pulling, MS, [facility], [facility] ?Triage Specialist ?[phone] ? ?

## 2021-11-11 NOTE — ED Triage Notes (Signed)
Pt arrive by law enforcement as IVC for evaluation for drug abuse, risk to danger to him self and others and not able to walk steady. ?

## 2021-11-12 ENCOUNTER — Inpatient Hospital Stay (HOSPITAL_COMMUNITY)
Admission: AD | Admit: 2021-11-12 | Discharge: 2021-11-14 | DRG: 897 | Disposition: A | Discharge status: Home / Self Care | Payer: Federal, State, Local not specified - Other | Source: Intra-hospital | Attending: Psychiatry | Admitting: Psychiatry

## 2021-11-12 ENCOUNTER — Other Ambulatory Visit: Payer: Self-pay | Admitting: Psychiatry

## 2021-11-12 ENCOUNTER — Encounter (HOSPITAL_COMMUNITY): Payer: Self-pay | Admitting: Psychiatry

## 2021-11-12 DIAGNOSIS — F1994 Other psychoactive substance use, unspecified with psychoactive substance-induced mood disorder: Secondary | ICD-10-CM | POA: Diagnosis present

## 2021-11-12 DIAGNOSIS — F1494 Cocaine use, unspecified with cocaine-induced mood disorder: Secondary | ICD-10-CM | POA: Diagnosis present

## 2021-11-12 DIAGNOSIS — R45850 Homicidal ideations: Secondary | ICD-10-CM

## 2021-11-12 DIAGNOSIS — F17210 Nicotine dependence, cigarettes, uncomplicated: Secondary | ICD-10-CM | POA: Diagnosis present

## 2021-11-12 DIAGNOSIS — F1490 Cocaine use, unspecified, uncomplicated: Secondary | ICD-10-CM | POA: Diagnosis present

## 2021-11-12 DIAGNOSIS — I10 Essential (primary) hypertension: Secondary | ICD-10-CM | POA: Diagnosis present

## 2021-11-12 DIAGNOSIS — Z20822 Contact with and (suspected) exposure to covid-19: Secondary | ICD-10-CM | POA: Diagnosis present

## 2021-11-12 DIAGNOSIS — F1014 Alcohol abuse with alcohol-induced mood disorder: Principal | ICD-10-CM | POA: Diagnosis present

## 2021-11-12 DIAGNOSIS — Z91040 Latex allergy status: Secondary | ICD-10-CM

## 2021-11-12 DIAGNOSIS — Z560 Unemployment, unspecified: Secondary | ICD-10-CM

## 2021-11-12 DIAGNOSIS — Z638 Other specified problems related to primary support group: Secondary | ICD-10-CM

## 2021-11-12 DIAGNOSIS — E559 Vitamin D deficiency, unspecified: Secondary | ICD-10-CM | POA: Diagnosis present

## 2021-11-12 DIAGNOSIS — F419 Anxiety disorder, unspecified: Secondary | ICD-10-CM | POA: Diagnosis present

## 2021-11-12 MED ORDER — THIAMINE HCL 100 MG PO TABS
100.0000 mg | ORAL_TABLET | Freq: Every day | ORAL | Status: DC
  Filled 2021-11-12 (×5): qty 1

## 2021-11-12 MED ORDER — HYDROXYZINE HCL 25 MG PO TABS: 25.0000 mg | ORAL_TABLET | Freq: Four times a day (QID) | ORAL | Status: DC | PRN

## 2021-11-12 MED ORDER — GABAPENTIN 100 MG PO CAPS
200.0000 mg | ORAL_CAPSULE | Freq: Three times a day (TID) | ORAL | Status: DC
  Filled 2021-11-12: qty 2

## 2021-11-12 MED ORDER — LORAZEPAM 1 MG PO TABS
1.0000 mg | ORAL_TABLET | Freq: Four times a day (QID) | ORAL | Status: DC | PRN
Start: 2021-11-12 — End: 2021-11-14

## 2021-11-12 MED ORDER — ONDANSETRON 4 MG PO TBDP: 4.0000 mg | ORAL_TABLET | Freq: Four times a day (QID) | ORAL | Status: DC | PRN

## 2021-11-12 MED ORDER — LOPERAMIDE HCL 2 MG PO CAPS: 2.0000 mg | ORAL_CAPSULE | ORAL | Status: DC | PRN

## 2021-11-12 NOTE — Consult Note (Signed)
Telepsych Consultation  ? ?Reason for Consult:  psych consult ?Referring Physician:  Sharilyn Sites PA-C ?Location of Patient:  [facility] ?Location of Provider: Behavioral [facility] TTS Department ? ?Patient Identification: Allen Henson ?MRN:  [id number] ?Principal Diagnosis: Alcohol abuse with alcohol-induced mood disorder (HCC) ?Diagnosis:  Principal Problem: ?  Alcohol abuse with alcohol-induced mood disorder (HCC) ?Active Problems: ?  Cocaine use ?  Substance induced mood disorder (HCC) ? ? ?Total Time spent with patient: 45 minutes ? ?Subjective:   ?Allen Henson is a 36 y.o. male patient admitted with IVC. Patient presents awake, alert, oriented. Blunt affect. Irritable mood.  ? ?"Cause I told her I was leaving her and she called told the police I was going to hurt myself.  ? ?Admits to cocaine use x1 week "usually partying with friends". Currently unemployed. States he came home later than intended at 415 am which sparked an argument with his girlfriend where he stated he will move out; says he told her he was laying down and later he was awakened by police to be bought in. Denies making any suicidal statements. 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 The two have 4 children together 10 mos, 3, 7, 9 (3 girls, 1 boy; been together since they were 51 years old). States girlfriend is mad because he came in late and mentioned moving out to his sister's home during the argument. He admits to alcohol and cocaine use stating alcohol is the primary "trigger" to the other substances but insists he only uses once a week on weekends. Verbalized familiarity of "ADC" for local drug treatment if and when he felt it was necessary; denies substance use as a "problem" at this time. Denies making any threats towards himself or girlfriend and any active plan or intent to hurt girlfriend or himself; insists he was trying to lay down to "sleep off the alcohol"  after a night out with friends; provided sister's information and permission to contact (+mother) for collateral.  ? ?Collateral:  ?Margot Chimes [phone] (petitioner) [phone] ?Denies any access to firearms in the home. States patient has been making threats to her and about killing himself. Says yesterday patient was intoxicated. The two do currently live together. He is unemployed, unable to maintain employment due to temper/attitude. States "he has a lot of trauma he needs to work through". His mother is around and agrees patient needs help.  ? ?Sumeet Geter [phone] (sister) [phone] ?When he gets drunk that's how he acts. Says girlfriend called last night and it seems it getting worse. States she has never witnessed it but doesn't see them everyday. Denies any knowledge of domestic violence in the past. States patient is not welcomed to her home. States she feels patient does need help and the drinking is out of hand; unable to contract for safety.  ? ?Zeiffer Mapp [phone] (mom) ?States patient has issues when he drinks. Drinking and mental; says patient drinks to mask mental issues. States when patient drinks "it's bad, he becomes a totally different person". Identifies alcohol is the main problem and acknowledges other substances used. Feels patient is a threat to himself and family at this time; does not feel patient is safe.  ? ?HPI:  Allen Henson is a 36 year old male patient with history of anxiety and hypertension who presented to [facility] with substance induced mood disorder via IVC by his girlfriend stating "Respondent stated  he wanted to kill himself, shoot himself it would be quicker to go and will not feel the pain. He states he is the Devil himself, he is hitting himself, he is slurry, can't walk without running into the wall, he does Cocaine and Molly and he is an Alcoholic". Patient reported taking 5-6 shots of vodka night prior, BAL 227 and less than "$10 bag" of cocaine where he states  he had "a couple of hits". Per chart review, patient has presented to ED with anxiety 05/31/17 after ingesting cocaine, alcohol, and ecstasy; 03/04/2015 (anxiety) after ingesting cocaine, 04/15/2014 (homicidal ideation) +cocaine, THC [facility]. UDS+ cocaine, THC. PDMP no prescriptions noted.  ? ?Past Psychiatric History: anxiety, homicidal ideation, cocaine use ? ?Risk to Self:  yes ?Risk to Others:  yes ?Prior Inpatient Therapy:  yes ?Prior Outpatient Therapy:  yes ? ?Past Medical History:  ?Past Medical History:  ?Diagnosis Date  ? Anxiety   ? Anxiety   ? Hypertension   ?  ?Past Surgical History:  ?Procedure Laterality Date  ? NO PAST SURGERIES    ? ?Family History: No family history on file. ?Family Psychiatric  History: not noted ?Social History:  ?Social History  ? ?Substance and Sexual Activity  ?Alcohol Use Yes  ? Comment: every weekend  ?   ?Social History  ? ?Substance and Sexual Activity  ?Drug Use Yes  ? Types: Marijuana  ?  ?Social History  ? ?Socioeconomic History  ? Marital status: Single  ?  Spouse name: Not on file  ? Number of children: Not on file  ? Years of education: Not on file  ? Highest education level: Not on file  ?Occupational History  ? Not on file  ?Tobacco Use  ? Smoking status: Every Day  ?  Packs/day: 1.00  ?  Types: Cigarettes  ?  Last attempt to quit: 10/15/2013  ?  Years since quitting: 8.0  ? Smokeless tobacco: Never  ?Substance and Sexual Activity  ? Alcohol use: Yes  ?  Comment: every weekend  ? Drug use: Yes  ?  Types: Marijuana  ? Sexual activity: Yes  ?  Birth control/protection: None  ?Other Topics Concern  ? Not on file  ?Social History Narrative  ? Not on file  ? ?Social Determinants of Health  ? ?Financial Resource Strain: Not on file  ?Food Insecurity: Not on file  ?Transportation Needs: Not on file  ?Physical Activity: Not on file  ?Stress: Not on file  ?Social Connections: Not on file  ? ?Additional Social History: ?  ?Allergies:   ?Allergies  ?Allergen  Reactions  ? Latex Hives and Other (See Comments)  ?  Headache  ? ?Labs:  ?Results for orders placed or performed during the hospital encounter of 11/11/21 (from the past 48 hour(s))  ?Resp Panel by RT-PCR (Flu A&B, Covid) Nasopharyngeal Swab     Status: None  ? Collection Time: 11/11/21 10:07 PM  ? Specimen: Nasopharyngeal Swab; Nasopharyngeal(NP) swabs in vial transport medium  ?Result Value Ref Range  ? SARS Coronavirus 2 by RT PCR NEGATIVE NEGATIVE  ?  Comment: (NOTE) ?SARS-CoV-2 target nucleic acids are NOT DETECTED. ? ?The SARS-CoV-2 RNA is generally detectable in upper respiratory ?specimens during the acute phase of infection. The lowest ?concentration of SARS-CoV-2 viral copies this assay can detect is ?138 copies/mL. A negative result does not preclude SARS-Cov-2 ?infection and should not be used as the sole basis for treatment or ?other patient management decisions. A negative  result may occur with  ?improper specimen collection/handling, submission of specimen other ?than nasopharyngeal swab, presence of viral mutation(s) within the ?areas targeted by this assay, and inadequate number of viral ?copies(<138 copies/mL). A negative result must be combined with ?clinical observations, patient history, and epidemiological ?information. The expected result is Negative. ? ?Fact Sheet for Patients:  ?BloggerCourse.com ? ?Fact Sheet for Healthcare Providers:  ?SeriousBroker.it ? ?This test is no t yet approved or cleared by the [place] FDA and  ?has been authorized for detection and/or diagnosis of SARS-CoV-2 by ?FDA under an Emergency Use Authorization (EUA). This EUA will remain  ?in effect (meaning this test can be used) for the duration of the ?COVID-19 declaration under Section 564(b)(1) of the Act, 21 ?U.S.C.section 360bbb-3(b)(1), unless the authorization is terminated  ?or revoked sooner.  ? ? ?  ? Influenza A by PCR NEGATIVE NEGATIVE  ? Influenza B  by PCR NEGATIVE NEGATIVE  ?  Comment: (NOTE) ?The Xpert Xpress SARS-CoV-2/FLU/RSV plus assay is intended as an aid ?in the diagnosis of influenza from Nasopharyngeal swab specimens and ?should not be used a

## 2021-11-12 NOTE — ED Notes (Signed)
Pt at nurses station calling family members to update them on placement.  ?

## 2021-11-12 NOTE — ED Notes (Addendum)
This RN called report to [facility] at [facility].  ?

## 2021-11-12 NOTE — ED Notes (Signed)
Breakfast order placed ?

## 2021-11-12 NOTE — ED Notes (Signed)
Patient on TSS °

## 2021-11-12 NOTE — BH Assessment (Signed)
Comprehensive Clinical Assessment (CCA) Note ? ?11/12/2021 ?Allen Henson ?[id number] ? ?Disposition: [name], NP pt to be observed and reassessed by psychiatry. Disposition discussed with Carin Hock, RN.  ? ?Flowsheet Row ED from 11/11/2021 in [facility] EMERGENCY DEPARTMENT  ?C-SSRS RISK CATEGORY High Risk  ? ?  ? ?The patient demonstrates the following risk factors for suicide: Chronic risk factors for suicide include: psychiatric disorder of Substance Induced Mood Disorder and substance use disorder. Acute risk factors for suicide include:  Per IVC: "Respondent stated he wanted to kill himself, shoot himself it would be quicker to go and will not feel the pain"  . Protective factors for this patient include:  Pt denies, content of IVC and can contract for safety . Considering these factors, the overall suicide risk at this point appears to be no risk. Patient is appropriate for outpatient follow up. ? ?Allen Henson is a 36 year old male who presents involuntary and unaccompanied to [facility]. Clinician asked the pt, "what brought you to the hospital?" Pt reports, his "baby mama" told the police he wanted to hurt himself because he said he was going to leave her. Pt reports, he asked his baby mama, "do you want me to leave now or can I take a nap?" Pt reports, she wanted him to stay but he told her he was still leaving. Pt reports, he was woken out of his sleep by like five police officers. Pt reports, he has scratches on his arm and text messages from his baby mama of the stuff she says but he doesn't have any rights. Pt denies, SI, HI, AVH, self-injurious behaviors and access to weapons. ? ?Pt was IVC'd by his girlfriend Allen Henson, [phone]). Per IVC paperwork: "Respondent stated he wanted to kill himself, shoot himself it would be quicker to go and will not feel the pain. He states he is the Devil himself, he is hitting himself, he is slurry, can't walk without running into the  wall, he does Cocaine and Molly and he is an Alcoholic."  ? ?Pt reports, taking 5-6 shots of Vodka last night (11/11/2021). Pt's BAL was 229 at 2227. Pt reports, he had a small amount not even $10 bag. Pt reports, he took a couple of hits. Pt reports, he uses when he stresses, about once per month and sometimes it will be over a month. Pt also reports, he has no stressors. Pt's BAL is positive for Cocaine and Marijuana. Pt denies, being linked to OPT resources (medication management and/or counseling.)   ? ?Pt presents irritable with normal speech. Pt's mood was irritable. Pt's affect was congruent. Pt's insight was fair. Pt's judgement was poor. Pt reports, if discharged he can contract for safety. Clinician asked the pt if he had anywhere to go when he leaves his girlfriend, pt did not respond. Pt reports, once discharged he's leaving his baby mama.  ? ?Diagnosis: Substance Induced Mood Disorder.  ?                    ? ?*Clinician contacted pt's girlfriend to gather additional information. Per girlfriend, the pt has a lot of childhood trauma and acts out when using Alcohol. Pt reports, his mother still uses Crack Cocaine. Pt's girlfriend reports, she told the pt she was leaving him because of his drug use, he then told her he wanted to kill and shoot himself because he was hurt. Pt girlfriend reports, she's going to leave him if  he doesn't get help. Pt's girlfriend reports, the pt said, "I am the Devil," stared at her, he speech was slurred and he was staggering as he was walking. Pt reports, she believes he was using Cocaine. Per girlfriend their children were not home.*  ? ?Chief Complaint:  ?Chief Complaint  ?Patient presents with  ? Psychiatric Evaluation  ? ?Visit Diagnosis:   ? ? ?CCA Screening, Triage and Referral (STR) ? ?Patient Reported Information ?How did you hear about us? Family/Friend ? ?What Is the Reason for Your Visit/Call Today? Per EDP note: "36 year old male with history of anxiety,  hypertension, polysubstance abuse, presenting to the ED under IVC petition by his girlfriend. Per paperwork, he has been abusing cocaine, Molly, and alcohol. He is threatening to shoot himself and self mutilating. Patient adamantly denies these claims, states "it's all a lie, they woke me up out of my sleep tonight with those papers". He denies SI/HI/AVH." ? ?How Long Has This Been Causing You Problems? > than 6 months ? ?What Do You Feel Would Help You the Most Today? Alcohol or Drug Use Treatment; Treatment for Depression or other mood problem ? ? ?Have You Recently Had Any Thoughts About Hurting Yourself? Yes (Per IVC however pt denies.) ? ?Are You Planning to Commit Suicide/Harm Yourself At This time? Yes (Per IVC however pt denies.) ? ? ?Have you Recently Had Thoughts About Hurting Someone [name]? No (Pt denies.) ? ?Are You Planning to Harm Someone at This Time? No ? ?Explanation: No data recorded ? ?Have You Used Any Alcohol or Drugs in the Past 24 Hours? Yes ? ?How Long Ago Did You Use Drugs or Alcohol? No data recorded ?What Did You Use and How Much? Pt reports, last night (tonight) he took 5-6 shots of Vodka. Pt's BAL was 229 at 2227. Pt reports, he used about a $10 bag of Cocaine last night (tonight). Pt's BAL is positive for Cocaine and Marijuana. ? ? ?Do You Currently Have a Therapist/Psychiatrist? No ? ?Name of Therapist/Psychiatrist: No data recorded ? ?Have You Been Recently Discharged From Any Office Practice or Programs? No data recorded ?Explanation of Discharge From Practice/Program: No data recorded ? ?  ?CCA Screening Triage Referral Assessment ?Type of Contact: Tele-Assessment ? ?Telemedicine Service Delivery: Telemedicine service delivery: This service was provided via telemedicine using a 2-way, interactive audio and video technology ? ?Is this Initial or Reassessment? Initial Assessment ? ?Date Telepsych consult ordered in [facility]:  11/11/21 ? ?Time Telepsych consult ordered in [facility]:   2321 ? ?[facility] ? ?[facility] ? ? ?Collateral Involvement: Allen ChimesBrittany Smith, Girlfriend/IVC Petitioner, [phone]. ? ? ?Does Patient Have a Automotive engineerCourt Appointed Legal Guardian? No data recorded ?Name and Contact of Legal Guardian: No data recorded ?If Minor and Not Living with Parent(s), Who has Custody? No data recorded ?Is CPS involved or ever been involved? No data recorded ?Is APS involved or ever been involved? No data recorded ? ?Patient Determined To Be At Risk for Harm To Self or Others Based on Review of Patient Reported Information or Presenting Complaint? Yes, for Self-Harm ? ?Method: No data recorded ?Availability of Means: No data recorded ?Intent: No data recorded ?Notification Required: No data recorded ?Additional Information for Danger to Others Potential: No data recorded ?Additional Comments for Danger to Others Potential: No data recorded ?Are There Guns or Other Weapons in Your Home? No data recorded ?Types of Guns/Weapons: No data recorded ?Are These Weapons Safely Secured?  No data recorded ?Who Could Verify You Are Able To Have These Secured: No data recorded ?Do You Have any Outstanding Charges, Pending Court Dates, Parole/Probation? No data recorded ?Contacted To Inform of Risk of Harm To Self or Others: Family/Significant Other: (Per IVC however pt denies.) ? ? ? ?Does Patient Present under Involuntary Commitment? Yes ? ?IVC Papers Initial File Date: 11/11/21 ? ? ?[place] of Residence: [place] ? ? ?Patient Currently Receiving the Following Services: Not Receiving Services ? ? ?Determination of Need: Urgent (48 hours) ? ? ?Options For Referral: Other: Comment (Pt to be observed and reassessed by psychiatry.) ? ? ? ? ?CCA Biopsychosocial ?Patient Reported Schizophrenia/Schizoaffective Diagnosis in Past: No data recorded ? ?Strengths: No data recorded ? ?Mental Health Symptoms ?Depression:   ?Irritability ?   ?Duration of Depressive symptoms:    ?Mania:   ?None (Pt denies.) ?  ?Anxiety:    ?Worrying ?  ?Psychosis:   ?None ?  ?Duration of Psychotic symptoms:    ?Trauma:   ?None (Pt denies.) ?  ?Obsessions:   ?None (Pt denies.) ?  ?Compul

## 2021-11-12 NOTE — Progress Notes (Signed)
Per Clayborne Dana, [facility], pt has been accepted to [facility] bed 307-2. Accepting provider is Dr. Nelda Marseille, Attending provider is Suzzanne Cloud. Patient can arrive by 3:30pm. Number for report is [phone]. ? ? ?Glennie Isle, MSW, LCSW-A ?Phone: [phone] ?Disposition/TOC ? ? ?

## 2021-11-12 NOTE — Tx Team (Signed)
Initial Treatment Plan ?11/12/2021 ?7:58 PM ?[name] ?[id number] ? ? ? ?PATIENT STRESSORS: ?Financial difficulties   ?Occupational concerns   ?Traumatic event   ? ? ?PATIENT STRENGTHS: ?Average or above average intelligence  ?Capable of independent living  ?Communication skills  ?Physical Health  ?Supportive family/friends  ? ? ?PATIENT IDENTIFIED PROBLEMS: ?Suicidal ideation (when intoxicated- pt denied)  ?  ?Alcohol a trigger  ?  ?unemployment  ?  ?  ?  ?  ?  ? ?DISCHARGE CRITERIA:  ?Improved stabilization in mood, thinking, and/or behavior ?Need for constant or close observation no longer present ?Reduction of life-threatening or endangering symptoms to within safe limits ?Verbal commitment to aftercare and medication compliance ? ?PRELIMINARY DISCHARGE PLAN: ?Attend 12-step recovery group ?Outpatient therapy ?Return to previous living arrangement ? ?PATIENT/FAMILY INVOLVEMENT: ?This treatment plan has been presented to and reviewed with the patient, Allen Henson, .  The patient has been given the opportunity to ask questions and make suggestions. ? ?Shela Nevin, RN ?11/12/2021, 7:58 PM ?

## 2021-11-12 NOTE — Progress Notes (Signed)
Patient is a 36 year old male admitted under IVC from [facility] Pt was IVC'd per girlfriend for making suicidal statements while intoxicated. Pt denied that he made suicidal statements while he was drunk, and stated that he believed that his girlfriend just wanted to see him get help. Pt stated that he would have agreed to go to ADT if he felt it was necessary. Pt reported that he drinks once a week on the weekends, and uses cocaine once every 2 weeks. Patient presented as calm, cooperative, answered questions logically and coherently throughout admission interview and assessment. . VS monitored and recorded. Skin check performed with another RN- belongings searched and secured in locker. Admission paperwork completed and signed.Patient was oriented to unit and schedule. Pt denies SI/HI/AVH at this time. PO fluids provided. Q 15 min checks initiated for safety.   ?

## 2021-11-12 NOTE — ED Notes (Signed)
Patient refused medication, said ,"ain't nothing wrong with me." York Spaniel we're trying to dope him up. ?

## 2021-11-13 DIAGNOSIS — F1014 Alcohol abuse with alcohol-induced mood disorder: Principal | ICD-10-CM

## 2021-11-13 MED ORDER — LISINOPRIL 5 MG PO TABS
5.0000 mg | ORAL_TABLET | Freq: Every day | ORAL | Status: DC
Start: 2021-11-13 — End: 2021-11-13

## 2021-11-13 MED ORDER — LISINOPRIL 5 MG PO TABS
5.0000 mg | ORAL_TABLET | Freq: Every day | ORAL | Status: DC
  Administered 2021-11-14: 5 mg via ORAL
  Filled 2021-11-13: qty 7
  Filled 2021-11-13 (×2): qty 1

## 2021-11-13 MED ORDER — LISINOPRIL 10 MG PO TABS
10.0000 mg | ORAL_TABLET | Freq: Once | ORAL | Status: AC
  Administered 2021-11-13: 10 mg via ORAL
  Filled 2021-11-13: qty 1

## 2021-11-13 NOTE — Progress Notes (Signed)
?   11/13/21 2108  ?Psych Admission Type (Psych Patients Only)  ?Admission Status Involuntary  ?Psychosocial Assessment  ?Patient Complaints None  ?Eye Contact Fair  ?Facial Expression Other (Comment) ?(appropriate)  ?Affect Appropriate to circumstance  ?Speech Logical/coherent  ?Interaction Assertive  ?Motor Activity Other (Comment) ?(q15 minute safety checks)  ?Appearance/Hygiene Unremarkable  ?Behavior Characteristics Cooperative;Appropriate to situation  ?Mood Pleasant  ?Thought Process  ?Coherency WDL  ?Content WDL  ?Delusions None reported or observed  ?Perception WDL  ?Hallucination None reported or observed  ?Judgment Poor  ?Confusion None  ?Danger to Self  ?Current suicidal ideation? Denies  ?Danger to Others  ?Danger to Others None reported or observed  ? ? ?

## 2021-11-13 NOTE — [facility] Group Notes (Addendum)
Adult Psychoeducational Group Not ?Date:  11/13/2021 ?Time:  7741-2878 ?Group Topic/Focus: PROGRESSIVE RELAXATION. A group where deep breathing is taught and tensing and relaxation muscle groups is used. Imagery is used as well.  Pts are asked to imagine 3 pillars that hold them up when they are not able to hold themselves up and to share that with the group. ? ?Participation Level:  Active ? ?Participation Quality:  Appropriate ? ?Affect:  Appropriate ? ?Cognitive:  Oriented ? ?Insight: Improving ? ?Engagement in Group:  Engaged ? ?Modes of Intervention:  Activity, Discussion, Education, and Support ? ?Additional Comments:  Pt was with a provider the whole time except for about 110 mins at the end of the group. He was able to rate his energy at a 1/10, and states he came here due to alcohol. Was interactive with his peers and participated in the end of group. ? ?Allen Henson A ? ? ?

## 2021-11-13 NOTE — [facility] Suicide Risk Assessment (Signed)
Suicide Risk Assessment ? ?Admission Assessment    ?[facility] Admission Suicide Risk Assessment ? ? ?Nursing information obtained from:  Patient ? ?Demographic factors:  Male, Unemployed ? ?Current Mental Status:  Suicidal ideation indicated by others ? ?Loss Factors:  Decrease in vocational status ? ?Historical Factors:  NA ? ?Risk Reduction Factors:  Sense of responsibility to family, Responsible for children under 83 years of age, Living with another person, especially a relative, Positive therapeutic relationship ? ?Total Time spent with patient: 1 hour ? ?Principal Problem: Alcohol abuse with alcohol-induced mood disorder (HCC) ? ?Diagnosis:  Principal Problem: ?  Alcohol abuse with alcohol-induced mood disorder (HCC) ?Active Problems: ?  Homicidal ideation ?  Cocaine use ?  Substance induced mood disorder (HCC) ? ?Subjective Data: 36 y.o. male patient admitted with IVC. Patient presents awake, alert, oriented. Blunt affect. Irritable mood. "Cause I told her I was leaving her and she called told the police I was going to hurt myself. Admits to cocaine use x1 week "usually partying with friends". Currently unemployed. States he came home later than intended at 415 am which sparked an argument with his girlfriend where he stated he will move out; says he told her he was laying down and later he was awakened by police to be bought in.  ? ?Continued Clinical Symptoms:  ?Alcohol Use Disorder Identification Test Final Score (AUDIT): 7 ?The "Alcohol Use Disorders Identification Test", Guidelines for Use in Primary Care, Second Edition.  World Science writer [facility]). ?Score between 0-7:  no or low risk or alcohol related problems. ?Score between 8-15:  moderate risk of alcohol related problems. ?Score between 16-19:  high risk of alcohol related problems. ?Score 20 or above:  warrants further diagnostic evaluation for alcohol dependence and treatment. ? ?CLINICAL FACTORS:  ? Alcohol/Substance Abuse/Dependencies ?Previous  Psychiatric Diagnoses and Treatments ? ? ?Musculoskeletal: ?Strength & Muscle Tone: within normal limits ?Gait & Station: normal ?Patient leans: N/A ? ?Psychiatric Specialty Exam: ? ?Presentation  ?General Appearance: Casual; Fairly Groomed; Appropriate for Environment ? ?Eye Contact:Good ? ?Speech:Clear and Coherent; Normal Rate ? ?Speech Volume:Normal ? ?Handedness:Right ? ?Mood and Affect  ?Mood:-- (Anxious about getting discharged.) ? ?Affect:Congruent ? ?Thought Process  ?Thought Processes:Coherent; Goal Directed ? ?Descriptions of Associations:Intact ? ?Orientation:Full (Time, Place and Person) ? ?Thought Content:Logical ? ?History of Schizophrenia/Schizoaffective disorder:No data recorded ?Duration of Psychotic Symptoms:No data recorded ?Hallucinations:Hallucinations: None ? ?Ideas of Reference:None ? ?Suicidal Thoughts:Suicidal Thoughts: No ? ?Homicidal Thoughts:Homicidal Thoughts: No ? ?Sensorium  ?Memory:Immediate Good; Recent Good; Remote Good ? ?Judgment:Fair ? ?Insight:Poor ? ?Executive Functions  ?Concentration:Fair ? ?Attention Span:Fair ? ?Recall:Good ? ?Fund of Knowledge:Good ? ?Language:Good ? ?Psychomotor Activity  ?Psychomotor Activity:Psychomotor Activity: Normal ? ?Assets  ?Assets:Communication Skills; Desire for Improvement; Housing; Social Support; Physical Health ? ?Sleep  ?Sleep:Sleep: Good ?Number of Hours of Sleep: 7.75 ? ?Physical Exam: See H&P. ?Blood pressure (!) 128/105, pulse 71, temperature 98 ?F (36.7 ?C), temperature source Oral, resp. rate 16, height 5\' 10"  (1.778 m), weight 93.9 kg, SpO2 100 %. Body mass index is 29.7 kg/m?. ? ?COGNITIVE FEATURES THAT CONTRIBUTE TO RISK:  ?Closed-mindedness and Thought constriction (tunnel vision)   ? ?SUICIDE RISK:  ? Mild:  Suicidal ideation of limited frequency, intensity, duration, and specificity.  There are no identifiable plans, no associated intent, mild dysphoria and related symptoms, good self-control (both objective and subjective  assessment), few other risk factors, and identifiable protective factors, including available and accessible social support. ? ?PLAN OF CARE: See H&P. ? ?I certify  that inpatient services furnished can reasonably be expected to improve the patient's condition.  ? ?Armandina Stammer, NP, pmhnp, fnp-bc. ?11/13/2021, 2:14 PM ?

## 2021-11-13 NOTE — Progress Notes (Signed)
?   11/12/21 2200  ?Psych Admission Type (Psych Patients Only)  ?Admission Status Involuntary  ?Psychosocial Assessment  ?Patient Complaints None  ?Eye Contact Fair  ?Facial Expression Flat  ?Affect Blunted  ?Speech Logical/coherent  ?Interaction Assertive  ?Motor Activity Slow  ?Appearance/Hygiene Improved  ?Behavior Characteristics Appropriate to situation;Cooperative  ?Mood Pleasant  ?Thought Process  ?Coherency WDL  ?Content WDL  ?Delusions None reported or observed  ?Perception WDL  ?Hallucination None reported or observed  ?Judgment Poor  ?Confusion None  ?Danger to Self  ?Current suicidal ideation? Denies  ?Danger to Others  ?Danger to Others None reported or observed  ? ? ?

## 2021-11-13 NOTE — Group Note (Signed)
Date:  11/13/2021 ?Time:  11:38 AM ? ?Group Topic/Focus:  ?Orientation:   The focus of this group is to educate the patient on the purpose and policies of crisis stabilization and provide a format to answer questions about their admission.  The group details unit policies and expectations of patients while admitted. ? ? ? ?Participation Level:  Minimal ? ?Participation Quality:  Inattentive ? ?Affect:  Blunted ? ?Cognitive:  Appropriate ? ?Insight: Lacking ? ?Engagement in Group:  Lacking ? ?Modes of Intervention:  Discussion ? ?Additional Comments:  Patient was lacking in his engagement in group. He appeared to be in a mood due to be awaken and asked to come to group.  ? ?Jerrye Beavers ?11/13/2021, 11:38 AM ? ?

## 2021-11-13 NOTE — H&P (Signed)
Psychiatric Admission Assessment Adult ? ?Patient Identification: Allen Henson ?MRN:  [id number] ?Date of Evaluation:  11/13/2021 ?Chief Complaint:  Alcohol abuse with alcohol-induced mood disorder (HCC) [F10.14] ?Principal Diagnosis: Alcohol abuse with alcohol-induced mood disorder (HCC) ?Diagnosis:  Principal Problem: ?  Alcohol abuse with alcohol-induced mood disorder (HCC) ?Active Problems: ?  Homicidal ideation ?  Cocaine use ?  Substance induced mood disorder (HCC) ? ?History of Present Illness:  (This consult notes was reviewed & agreed with the contents as documented): Allen Henson is a 36 y.o. male patient admitted with IVC. Patient presents awake, alert, oriented. Blunt affect. Irritable mood. "Cause I told her I was leaving her and she called told the police I was going to hurt myself. Admits to cocaine use x1 week "usually partying with friends". Currently unemployed. States he came home later than intended at 415 am which sparked an argument with his girlfriend where he stated he will move out; says he told her he was laying down and later he was awakened by police to be bought in. Denies making any suicidal statements. 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 The two have 4 children together 10 mos, 3, 7, 9 (3 girls, 1 boy; been together since they were 66 years old). States girlfriend is mad because he came in late and mentioned moving out to his sister's home during the argument. He admits to alcohol and cocaine use stating alcohol is the primary "trigger" to the other substances but insists he only uses once a week on weekends. Verbalized familiarity of "ADC" for local drug treatment if and when he felt it was necessary; denies substance use as a "problem" at this time. Denies making any threats towards himself or girlfriend and any active plan or intent to hurt girlfriend or himself; insists he was trying to lay down to "sleep  off the alcohol" after a night out with friends; provided sister's information and permission to contact (+mother) for collateral.  ?  ?Collateral:  ?Margot Chimes [phone] (petitioner) [phone] ?Denies any access to firearms in the home. States patient has been making threats to her and about killing himself. Says yesterday patient was intoxicated. The two do currently live together. He is unemployed, unable to maintain employment due to temper/attitude. States "he has a lot of trauma he needs to work through". His mother is around and agrees patient needs help.  ?  ?Kohlton Gilpatrick [phone] (sister) [phone] ?When he gets drunk that's how he acts. Says girlfriend called last night and it seems it getting worse. States she has never witnessed it but doesn't see them everyday. Denies any knowledge of domestic violence in the past. States patient is not welcomed to her home. States she feels patient does need help and the drinking is out of hand; unable to contract for safety.  ?  ?Zeiffer Mapp [phone] (mom) ?States patient has issues when he drinks. Drinking and mental; says patient drinks to mask mental issues. States when patient drinks "it's bad, he becomes a totally different person". Identifies alcohol is the main problem and acknowledges other substances used. Feels patient is a threat to himself and family at this time; does not feel patient is safe.  ?  ?During this evaluation: Allen Henson admit to drinking once a week (heavily) with buddies at the club. Thinks that he may have alcohol abuse issues. Says he is willing to do some of kind of  treatment with the support of his girlfriend. However, denies any symptoms of depression or anxiety. Wants to be discharged today to spend easter Sunday with family. ? ?Associated Signs/Symptoms: ? ?Depression Symptoms:   Patient currently denies any symptoms of depression, denies any suicidal ideations or thoughts. ? ?Duration of Depression Symptoms: Denies. ? ?(Hypo)  Manic Symptoms:  Impulsivity, ?Irritable Mood, ? ?Anxiety Symptoms:   Excessive worry about getting discharged. ? ?Psychotic Symptoms:   Denies any hallucinations, delusional thoughts or paranoia. ? ?PTSD Symptoms: ?NA ? ?Total Time spent with patient: 1 hour ? ?Past Psychiatric History: CAD  ? ?Is the patient at risk to self? No.  ?Has the patient been a risk to self in the past 6 months? No.  ?Has the patient been a risk to self within the distant past? No.  ?Is the patient a risk to others? No.  ?Has the patient been a risk to others in the past 6 months? No.  ?Has the patient been a risk to others within the distant past? No.  ? ?Prior Inpatient Therapy: Yes, [facility] in 2015. ? ?Prior Outpatient Therapy: Denies ? ?Alcohol Screening: 1. How often do you have a drink containing alcohol?: 2 to 4 times a month ?2. How many drinks containing alcohol do you have on a typical day when you are drinking?: 3 or 4 ?3. How often do you have six or more drinks on one occasion?: Never ?AUDIT-C Score: 3 ?4. How often during the last year have you found that you were not able to stop drinking once you had started?: Never ?5. How often during the last year have you failed to do what was normally expected from you because of drinking?: Never ?6. How often during the last year have you needed a first drink in the morning to get yourself going after a heavy drinking session?: Never ?7. How often during the last year have you had a feeling of guilt of remorse after drinking?: Never ?8. How often during the last year have you been unable to remember what happened the night before because you had been drinking?: Never ?9. Have you or someone else been injured as a result of your drinking?: No ?10. Has a relative or friend or a doctor or another health worker been concerned about your drinking or suggested you cut down?: Yes, during the last year ?Alcohol Use Disorder Identification Test Final Score (AUDIT): 7 ?Alcohol Brief  Interventions/Follow-up: Patient Refused ? ?Substance Abuse History in the last 12 months:  Yes.   ? ?Consequences of Substance Abuse: Discussed witg patient during this admission evaluation. ?Medical Consequences:  Liver damage, Possible death by overdose ?Legal Consequences:  Arrests, jail time, Loss of driving privilege. ?Family Consequences:  Family discord, divorce and or separation.  ? ?Previous Psychotropic Medications:  "Yes, Xanax, Ativan ? ?Psychological Evaluations: No  ? ?Past Medical History:  ?Past Medical History:  ?Diagnosis Date  ? Anxiety   ? Anxiety   ? Hypertension   ?  ?Past Surgical History:  ?Procedure Laterality Date  ? NO PAST SURGERIES    ? ?Family History: History reviewed. No pertinent family history. ? ?Family Psychiatric  History: Denies any family hx of mental illness or substance use disorder. ? ?Tobacco Screening:   ? ?Social History: Single, has 4 children, currently unemployed.  ?Social History  ? ?Substance and Sexual Activity  ?Alcohol Use Yes  ? Comment: every weekend  ?   ?Social History  ? ?Substance and Sexual Activity  ?Drug Use  Yes  ? Types: Marijuana  ?  ?Additional Social History: ? ?Allergies:   ?Allergies  ?Allergen Reactions  ? Latex Hives and Other (See Comments)  ?  Headache  ? ?Lab Results:  ?Results for orders placed or performed during the hospital encounter of 11/11/21 (from the past 48 hour(s))  ?Resp Panel by RT-PCR (Flu A&B, Covid) Nasopharyngeal Swab     Status: None  ? Collection Time: 11/11/21 10:07 PM  ? Specimen: Nasopharyngeal Swab; Nasopharyngeal(NP) swabs in vial transport medium  ?Result Value Ref Range  ? SARS Coronavirus 2 by RT PCR NEGATIVE NEGATIVE  ?  Comment: (NOTE) ?SARS-CoV-2 target nucleic acids are NOT DETECTED. ? ?The SARS-CoV-2 RNA is generally detectable in upper respiratory ?specimens during the acute phase of infection. The lowest ?concentration of SARS-CoV-2 viral copies this assay can detect is ?138 copies/mL. A negative result does  not preclude SARS-Cov-2 ?infection and should not be used as the sole basis for treatment or ?other patient management decisions. A negative result may occur with  ?improper specimen collection/handling, submission

## 2021-11-13 NOTE — Progress Notes (Signed)
Patient did not attend wrap up group. 

## 2021-11-13 NOTE — [facility] Counselor (Signed)
Adult Comprehensive Assessment ? ?Patient ID: Allen Henson, male   DOB: 1985/08/21, 36 y.o.   MRN: [id number] ? ?Information Source: ?Information source: Patient ? ?Current Stressors:  ?Patient states their primary concerns and needs for treatment are:: Baby's mama put him in the hospital by lying about him after he told her he was leaving - he only meant for a few days. 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  He states he is tired of the arguing, but they have built a life together. ?Financial / Lack of resources (include bankruptcy): Needs the job in order to provide for his kids. ?Housing / Lack of housing: Denies stressors ?Physical health (include injuries & life threatening diseases): Denies stressors ?Social relationships: Denies stressors ?Substance abuse: States he drinks one time a week, on the weekends, and when he does drink, it is a lot. ?Bereavement / Loss: Denies stressors ? ?Living/Environment/Situation:  ?Living Arrangements: Spouse/significant other, Children ?Living conditions (as described by patient or guardian): Good ?Who else lives in the home?: Baby mama and their 4 children ?How long has patient lived in current situation?: Since 2012 ?What is atmosphere in current home: Chaotic, Comfortable ? ?Family History:  ?Marital status: Long term relationship ?Long term relationship, how long?: 12 years ?What types of issues is patient dealing with in the relationship?: Arguing ?Are you sexually active?: Yes ?What is your sexual orientation?: Straight ?Does patient have children?: Yes ?How many children?: 4 ?How is patient's relationship with their children?: 10 months old, 3yo, 7yo, 19yo - excellent relationship  with all -- takes them to school and picks them up daily ? ?Childhood History:  ?By whom was/is the patient raised?: Mother ?Description of patient's relationship with caregiver when they were a child: Mother - good relationship; Father - could see him, but did not want to ?Patient's description of current relationship with people who raised him/her: Mother - good relationship; Father - could still see him, lives around the corner, but does not want to ?How were you disciplined when you got in trouble as a child/adolescent?: stuff taken away, spankings ?Does patient have siblings?: Yes ?Number of Siblings: 2 ?Description of patient's current relationship with siblings: 1 brother, 1 sister - both older - good relationships ?Did patient suffer any verbal/emotional/physical/sexual abuse as a child?: No (Patient states he had no abuse as a child, but his significant other states he had significant trauma in childhood.) ?Did patient suffer from severe childhood neglect?: No ?Has patient ever been sexually abused/assaulted/raped as an adolescent or adult?: No ?Was the patient ever a victim of a crime or a disaster?: No (Has been in prison 3 times for a total of over 5 years.) ?Witnessed domestic violence?: No ?Has patient been affected by domestic violence as an adult?: No ? ?Education:  ?Highest grade of school patient has completed: 10th grade ?Currently a student?: No ?Learning disability?: No ? ?Employment/Work Situation:   ?Employment Situation: Employed ?Where is Patient Currently Employed?: [employer] ?How Long has Patient Been Employed?: Supposed to have 3rd interview Tuesday 4/11. ?Work Stressors: Pt denies, stressors. ?Patient's Job has Been Impacted by Current Illness: Yes ?Describe how Patient's Job has Been Impacted: Cannot go to the 3rd interview and secure the job  if he is still in the hospital ?What is the Longest Time Patient has Held a Job?: 3 years ?Where was the Patient Employed at that Time?: Had  two jobs cooking and stacking shelves ?Has Patient ever Been in the Military?: No ? ?Financial Resources:   ?Financial resources: No income ?Does patient have a representative payee or guardian?: No ? ?Alcohol/Substance Abuse:   ?What has been your use of drugs/alcohol within the last 12 months?: Alcohol on weekends ("a lot"), some powder cocaine about once every other month.  Does not mention marijuana, but his UDS is positive for cocaine and marijuana. ?Alcohol/Substance Abuse Treatment Hx: Past Tx, Outpatient ?If yes, describe treatment: Did [facility] and was required to go to [facility] in the past ?Has alcohol/substance abuse ever caused legal problems?: No ? ?Social Support System:   ?Patient's Community Support System: Good ?Describe Community Support System: Kids and mother ?Type of faith/religion: Rasta ?How does patient's faith help to cope with current illness?: Question not asked ? ?Leisure/Recreation:   ?Do You Have Hobbies?: Yes ?Leisure and Hobbies: Doing tattoos, cooking and cutting hair. ? ?Strengths/Needs:   ?What is the patient's perception of their strengths?: Cooking, hard working, being a provider, art, spending time with kids ?Patient states they can use these personal strengths during their treatment to contribute to their recovery: "Keep me sane" ?Patient states these barriers may affect/interfere with their treatment: Needs his hypertension medication, is not interested in any other medicines. ?Patient states these barriers may affect their return to the community: N/A ?Other important information patient would like considered in planning for their treatment: N/A ? ?Discharge Plan:   ?Currently receiving community mental health services: No ?Patient states concerns and preferences for aftercare planning are: Wants to be referred to a place he can go for classes or counseling on weekends about his alcohol use. ?Patient states they will know when they are safe and ready for discharge when: Feels he  should not be here in the first place, so feels he is ready. ?Does patient have access to transportation?: Yes (Can get a ride or pay for a cab.) ?Does patient have financial barriers related to discharge medications?: Yes ?Patient description of barriers related to discharge medications: No insurance ?Plan for living situation after discharge: Is not sure if he will immediately go back home with his kids and significant other.  May go stay with mother, if he can. ?Will patient be returning to same living situation after discharge?: No ? ?Summary/Recommendations:   ?Summary and Recommendations (to be completed by the evaluator): Patient is a 36yo male who is hospitalized under IVC by his girlfriend Margot Chimes, [phone]). Per IVC paperwork: "Respondent stated he wanted to kill himself, shoot himself it would be quicker to go and will not feel the pain. He states he is the Devil himself, he is hitting himself, he is slurry, can't walk without running into the wall, he does Cocaine and Molly and he is an Alcoholic."  He reports drinking alcohol only on weekends and using marijuana and cocaine occasionally.  He denies having childhood trauma, but his girlfriend reports that he does and that this affects him using substances.  The patient states he may go stay with his mother at discharge, but it is reported that his mother is still using crack cocaine at this time.  Patient and his girlfriend share 4 children who were not home at the time of the incident.  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  He wants to be able to provide for his children.  He refuses psychiatric medicines but is interested in therapy or classes for alcohol, only on the weekends when he is not working and to keep him sober.  Patient would benefit from group therapy,  medication management, psychoeducation, crisis stabilization, peer support and discharge planning.  At discharge it is recommended that the patient adhere to the established aftercare plan. ? ?Caren Hazy-

## 2021-11-13 NOTE — Progress Notes (Addendum)
D. Pt presents with a flat affect/ anxious mood- has been appropriate during interactions, rated his depression, hopelessness and anxiety all 0's today. Pt stated that his goal was "to work on going home to my four kids so I can be better for them."  Pt observed in the milieu interacting well with peers and staff, and attending groups. Pt currently denies SI/HI and AVH   ?A. Labs and vitals monitored.  Pt supported emotionally and encouraged to express concerns and ask questions.   ?R. Pt remains safe with 15 minute checks. Will continue POC. ? ?  ?

## 2021-11-13 NOTE — Group Note (Signed)
LCSW Group Therapy Note ? ?Date/Time:    11/13/2021  10am-11am ? ?Type of Therapy and Topic:   ?Group Therapy:  Saying Goodbye to an Unhealthy Coping Skill or Unhealthy Support ? ?Participation Level:  Active  ? ?Description of Group: ? ?A handout was provided to the patients in lieu of group, although they were in a group setting at the time (in the hallway).  The handout listed characteristics of unhealthy coping skills/supports and healthy coping skills/supports.  There were spaces left for the patient to add their own ideas.  The handout then gave them space to make a personal list of their own healthy and unhealthy coping skills, as well as additional space to make a personal list of their own healthy and unhealthy supports.  They were then given the opportunity to choose stationery on which to write a goodbye letter to one of their unhealthy coping skills or supports. ? ?Therapeutic Goals ?Patients learned that the main similarity between healthy and unhealthy coping techniques and supports is that they all work initially, and the main difference is that the unhealthy ones stop working and/or start hurting. ?Patients were given the opportunity to identify some of their own unhealthy habits and/or supports. ?Patients chose stationery to use to write a goodbye letter to an unhealthy habit or support  ?Patients were encouraged to provide support and ideas to each other. ? ?Summary of Patient Progress: Patient was enthusiastic about the activity, asked for multiple pieces of stationery. ? ? ?Therapeutic Modalities ?Activity  ?Processing ? ? ?Ambrose Mantle, LCSW  ?

## 2021-11-14 ENCOUNTER — Encounter (HOSPITAL_COMMUNITY): Payer: Self-pay

## 2021-11-14 DIAGNOSIS — E559 Vitamin D deficiency, unspecified: Secondary | ICD-10-CM | POA: Diagnosis present

## 2021-11-14 LAB — MAGNESIUM: Magnesium: 2.3 mg/dL (ref 1.7–2.4)

## 2021-11-14 LAB — TSH: TSH: 1.829 u[IU]/mL (ref 0.350–4.500)

## 2021-11-14 LAB — LIPID PANEL
Cholesterol: 162 mg/dL (ref 0–200)
HDL: 57 mg/dL (ref >40)
LDL Cholesterol: 84 mg/dL (ref 0–99)
Total CHOL/HDL Ratio: 2.8 RATIO
Triglycerides: 105 mg/dL (ref <150)
VLDL: 21 mg/dL (ref 0–40)

## 2021-11-14 LAB — VITAMIN D 25 HYDROXY (VIT D DEFICIENCY, FRACTURES): Vit D, 25-Hydroxy: 8.9 ng/mL — ABNORMAL LOW (ref 30–100)

## 2021-11-14 LAB — HEMOGLOBIN A1C
Hgb A1c MFr Bld: 5.4 % (ref 4.8–5.6)
Mean Plasma Glucose: 108.28 mg/dL

## 2021-11-14 LAB — VITAMIN B12: Vitamin B-12: 288 pg/mL (ref 180–914)

## 2021-11-14 LAB — RPR: RPR Ser Ql: NONREACTIVE

## 2021-11-14 MED ORDER — VITAMIN D (ERGOCALCIFEROL) 1.25 MG (50000 UNIT) PO CAPS
50000.0000 [IU] | ORAL_CAPSULE | ORAL | Status: DC
  Administered 2021-11-14: 50000 [IU] via ORAL
  Filled 2021-11-14 (×2): qty 1

## 2021-11-14 MED ORDER — LISINOPRIL 5 MG PO TABS: 5.0000 mg | ORAL_TABLET | Freq: Every day | ORAL | 0 refills | Status: AC

## 2021-11-14 MED ORDER — VITAMIN D (ERGOCALCIFEROL) 1.25 MG (50000 UNIT) PO CAPS: 50000.0000 [IU] | ORAL_CAPSULE | ORAL | 0 refills | Status: AC

## 2021-11-14 MED ORDER — STRESS FORMULA/ZINC PO TABS: 1.0000 | ORAL_TABLET | Freq: Every day | ORAL | 0 refills | Status: AC

## 2021-11-14 MED ORDER — HYDROXYZINE HCL 25 MG PO TABS: 25.0000 mg | ORAL_TABLET | Freq: Four times a day (QID) | ORAL | 0 refills | Status: AC | PRN

## 2021-11-14 NOTE — BH IP Treatment Plan (Signed)
Interdisciplinary Treatment and Diagnostic Plan Update ? ?11/14/2021 ?Time of Session: 10:10am ?Allen Henson ?MRN: [id number] ? ?Principal Diagnosis: Alcohol abuse with alcohol-induced mood disorder (HCC) ? ?Secondary Diagnoses: Principal Problem: ?  Alcohol abuse with alcohol-induced mood disorder (HCC) ?Active Problems: ?  Homicidal ideation ?  Cocaine use ?  Substance induced mood disorder (HCC) ?  Vitamin D deficiency ? ? ?Current Medications:  ?Current Facility-Administered Medications  ?Medication Dose Route Frequency Provider Last Rate Last Admin  ? hydrOXYzine (ATARAX) tablet 25 mg  25 mg Oral Q6H PRN Nira Conn A, NP      ? lisinopril (ZESTRIL) tablet 5 mg  5 mg Oral Daily Armandina Stammer I, NP   5 mg at 11/14/21 0801  ? loperamide (IMODIUM) capsule 2-4 mg  2-4 mg Oral PRN Jackelyn Poling, NP      ? LORazepam (ATIVAN) tablet 1 mg  1 mg Oral Q6H PRN Nira Conn A, NP      ? ondansetron (ZOFRAN-ODT) disintegrating tablet 4 mg  4 mg Oral Q6H PRN Nira Conn A, NP      ? thiamine tablet 100 mg  100 mg Oral Daily Jackelyn Poling, NP      ? Vitamin D (Ergocalciferol) (DRISDOL) capsule 50,000 Units  50,000 Units Oral Q7 days Roselle Locus, MD   50,000 Units at 11/14/21 1018  ? ?PTA Medications: ?Medications Prior to Admission  ?Medication Sig Dispense Refill Last Dose  ? ibuprofen (ADVIL) 200 MG tablet Take 200 mg by mouth every 6 (six) hours as needed for headache.     ? ? ?Patient Stressors: Financial difficulties   ?Occupational concerns   ?Traumatic event   ? ?Patient Strengths: Average or above average intelligence  ?Capable of independent living  ?Communication skills  ?Physical Health  ?Supportive family/friends  ? ?Treatment Modalities: Medication Management, Group therapy, Case management,  ?1 to 1 session with clinician, Psychoeducation, Recreational therapy. ? ? ?Physician Treatment Plan for Primary Diagnosis: Alcohol abuse with alcohol-induced mood disorder (HCC) ?Long Term Goal(s): Improvement  in symptoms so as ready for discharge  ? ?Short Term Goals: Ability to identify and develop effective coping behaviors will improve ?Ability to maintain clinical measurements within normal limits will improve ?Compliance with prescribed medications will improve ?Ability to identify triggers associated with substance abuse/mental health issues will improve ?Ability to identify changes in lifestyle to reduce recurrence of condition will improve ?Ability to verbalize feelings will improve ?Ability to disclose and discuss suicidal ideas ?Ability to demonstrate self-control will improve ? ?Medication Management: Evaluate patient's response, side effects, and tolerance of medication regimen. ? ?Therapeutic Interventions: 1 to 1 sessions, Unit Group sessions and Medication administration. ? ?Evaluation of Outcomes: Adequate for Discharge ? ?Physician Treatment Plan for Secondary Diagnosis: Principal Problem: ?  Alcohol abuse with alcohol-induced mood disorder (HCC) ?Active Problems: ?  Homicidal ideation ?  Cocaine use ?  Substance induced mood disorder (HCC) ?  Vitamin D deficiency ? ?Long Term Goal(s): Improvement in symptoms so as ready for discharge  ? ?Short Term Goals: Ability to identify and develop effective coping behaviors will improve ?Ability to maintain clinical measurements within normal limits will improve ?Compliance with prescribed medications will improve ?Ability to identify triggers associated with substance abuse/mental health issues will improve ?Ability to identify changes in lifestyle to reduce recurrence of condition will improve ?Ability to verbalize feelings will improve ?Ability to disclose and discuss suicidal ideas ?Ability to demonstrate self-control will improve    ? ?Medication Management: Evaluate patient's  response, side effects, and tolerance of medication regimen. ? ?Therapeutic Interventions: 1 to 1 sessions, Unit Group sessions and Medication administration. ? ?Evaluation of Outcomes:  Adequate for Discharge ? ? ?RN Treatment Plan for Primary Diagnosis: Alcohol abuse with alcohol-induced mood disorder (HCC) ?Long Term Goal(s): Knowledge of disease and therapeutic regimen to maintain health will improve ? ?Short Term Goals: Ability to remain free from injury will improve, Ability to verbalize frustration and anger appropriately will improve, Ability to demonstrate self-control, Ability to identify and develop effective coping behaviors will improve, and Compliance with prescribed medications will improve ? ?Medication Management: RN will administer medications as ordered by provider, will assess and evaluate patient's response and provide education to patient for prescribed medication. RN will report any adverse and/or side effects to prescribing provider. ? ?Therapeutic Interventions: 1 on 1 counseling sessions, Psychoeducation, Medication administration, Evaluate responses to treatment, Monitor vital signs and CBGs as ordered, Perform/monitor CIWA, COWS, AIMS and Fall Risk screenings as ordered, Perform wound care treatments as ordered. ? ?Evaluation of Outcomes: Adequate for Discharge ? ? ?LCSW Treatment Plan for Primary Diagnosis: Alcohol abuse with alcohol-induced mood disorder (HCC) ?Long Term Goal(s): Safe transition to appropriate next level of care at discharge, Engage patient in therapeutic group addressing interpersonal concerns. ? ?Short Term Goals: Engage patient in aftercare planning with referrals and resources, Increase social support, Increase ability to appropriately verbalize feelings, Identify triggers associated with mental health/substance abuse issues, and Increase skills for wellness and recovery ? ?Therapeutic Interventions: Assess for all discharge needs, 1 to 1 time with Child psychotherapist, Explore available resources and support systems, Assess for adequacy in community support network, Educate family and significant other(s) on suicide prevention, Complete Psychosocial  Assessment, Interpersonal group therapy. ? ?Evaluation of Outcomes: Adequate for Discharge ? ? ?Progress in Treatment: ?Attending groups: Yes. ?Participating in groups: Yes. ?Taking medication as prescribed: Yes. ?Toleration medication: Yes. ?Family/Significant other contact made: Yes, individual(s) contacted:  mother ?Patient understands diagnosis: Yes. ?Discussing patient identified problems/goals with staff: Yes. ?Medical problems stabilized or resolved: Yes. ?Denies suicidal/homicidal ideation: Yes. ?Issues/concerns per patient self-inventory: No. ? ?New problem(s) identified: No, Describe:  none ? ?New Short Term/Long Term Goal(s): detox, medication management for mood stabilization; elimination of SI thoughts; development of comprehensive mental wellness/sobriety plan ? ?Patient Goals:  "Got BP medicine, to get substance use treatment on the weekends" ? ?Discharge Plan or Barriers: Patient is to return home. Patient has been provided with AA/NA meetings to attend at discharge  ? ?Reason for Continuation of Hospitalization: Medication stabilization ? ?Estimated Length of Stay: Adequate for discharge ? ?Last 3 [place] Suicide Severity Risk Score: ?Flowsheet Row [facility] (Current) [facility] from 11[facility]11/2021 in [facility]  ?C-SSRS RISK CATEGORY No Risk High Risk  ? ?  ? ? ?Last PHQ 2/9 Scores: ?   ? View : No data to display.  ?  ?  ?  ? ? ?Scribe for Treatment Team: ?Otelia Santee, LCSW ?11/14/2021 ?10:53 AM ? ? ?

## 2021-11-14 NOTE — Discharge Summary (Signed)
Physician Discharge Summary Note ? ?Patient:  Allen HumphreyKevin D Dicamillo is an 36 y.o., male ?MRN:  [id number] ?DOB:  06-19-1986 ?Patient phone:  [phone] (home)  ?Patient address:   ?[address] ?[place] [id number],  ?Total Time spent with patient: 30 minutes ? ?Date of Admission:  11/12/2021 ?Date of Discharge: 11/14/2021 ? ?Reason for Admission:  As per [facility], "Allen HumphreyKevin D Castoro is a 36 y.o. male.  The history is provided by the patient and medical records.  36 year old male with history of anxiety, hypertension, polysubstance abuse, presenting to the ED under IVC petition by his girlfriend.  Per paperwork, he has been abusing cocaine, Molly, and alcohol.  He is threatening to shoot himself and self mutilating.  Patient adamantly denies these claims, states "it's all a lie, they woke me up out of my sleep tonight with those papers".  He denies SI/HI/AVH." ? ?Principal Problem: Alcohol abuse with alcohol-induced mood disorder (HCC) ?Discharge Diagnoses: Principal Problem: ?  Alcohol abuse with alcohol-induced mood disorder (HCC) ?Active Problems: ?  Homicidal ideation ?  Cocaine use ?  Substance induced mood disorder (HCC) ?  Vitamin D deficiency ? ?Past Psychiatric History: As above ? ?Past Medical History:  ?Past Medical History:  ?Diagnosis Date  ? Anxiety   ? Anxiety   ? Hypertension   ?  ?Past Surgical History:  ?Procedure Laterality Date  ? NO PAST SURGERIES    ? ?Family History: History reviewed. No pertinent family history. ?Family Psychiatric  History: None reported ?Social History:  ?Social History  ? ?Substance and Sexual Activity  ?Alcohol Use Yes  ? Comment: every weekend  ?   ?Social History  ? ?Substance and Sexual Activity  ?Drug Use Yes  ? Types: Marijuana  ?  ?Social History  ? ?Socioeconomic History  ? Marital status: Single  ?  Spouse name: Not on file  ? Number of children: Not on file  ? Years of education: Not on file  ? Highest education level: Not on file   ?Occupational History  ? Not on file  ?Tobacco Use  ? Smoking status: Every Day  ?  Packs/day: 1.00  ?  Types: Cigarettes  ?  Last attempt to quit: 10/15/2013  ?  Years since quitting: 8.0  ? Smokeless tobacco: Never  ?Vaping Use  ? Vaping Use: Never used  ?Substance and Sexual Activity  ? Alcohol use: Yes  ?  Comment: every weekend  ? Drug use: Yes  ?  Types: Marijuana  ? Sexual activity: Yes  ?  Birth control/protection: None  ?Other Topics Concern  ? Not on file  ?Social History Narrative  ? Not on file  ? ?Social Determinants of Health  ? ?Financial Resource Strain: Not on file  ?Food Insecurity: Not on file  ?Transportation Needs: Not on file  ?Physical Activity: Not on file  ?Stress: Not on file  ?Social Connections: Not on file  ? ?                                            Hospital Course ? ?During the patient's hospitalization, patient had extensive initial psychiatric evaluation, and follow-up psychiatric evaluations every day.  Psychiatric diagnoses provided upon initial assessment were as follows: ?-Alcohol abuse with alcohol-induced mood disorder ?-Active Problems: ?-Homicidal ideation ?-Cocaine use ?-Substance induced mood disorder  ?Patient  declined initiation of any medications to treat mental health related issues. Other medications that were started on admission are as follows: ?- Mylanta 30 ml po Q 6 hours prn for indigestion.  ?- MOM 15 ml po q daily prn for constipation.  ?- Continue Hydroxyzine 25 mg po Q 6 hrs prn for anxiety. ?- Continue Ativan 1 mg po Q 6 hrs prn for CIWA >10 x 72 hrs.  ?- Continue Zofran-ODT 4 mg po Q 6 hrs prn for anxiety. ? ?Patient is being discharged home on Hydroxyzine Q6H PRN for anxiety, Lisinopril 5 mg daily for hypertension, and Vitamin D 50,000 units every 7 days.  ?Patient's care was discussed during the interdisciplinary team meeting every day during the hospitalization. The patient was evaluated each day by a clinical provider to ascertain response to  treatment. Improvement was noted by the patient's report of decreasing symptoms, improved sleep and appetite, affect, medication tolerance, behavior, and participation in unit programming.  Patient was asked each day to complete a self inventory noting mood, mental status, pain, new symptoms, anxiety and concerns. Symptoms were reported as significantly decreased or resolved completely by discharge. The patient reports that their mood is stable.  ?The patient denied having suicidal thoughts for more than 48 hours prior to discharge.  Patient denies having homicidal thoughts.  Patient denies having auditory hallucinations.  Patient denies any visual hallucinations or other symptoms of psychosis. The patient is motivated to continue taking medication with a goal of continued improvement in mental health.  ? ?The patient reports their target psychiatric symptoms of substance use disorder responded well to the medications, and the patient reports overall benefit from this psychiatric hospitalization. Supportive psychotherapy was provided to the patient. The patient also participated in regular group therapy while hospitalized. Coping skills, problem solving as well as relaxation therapies were also part of the unit programming. ? ?Labs were reviewed with the patient, and abnormal results were discussed with the patient. Pt is agreeable to following up with his Primary care Provider regarding his low Vitamin D level of 8.90. ? ?Physical Findings: ?AIMS: 0 ?CIWA:  CIWA-Ar Total: 0 ?COWS: n/a   ? ?Musculoskeletal: ?Strength & Muscle Tone: within normal limits ?Gait & Station: normal ?Patient leans: N/A ? ?Psychiatric Specialty Exam: ? ?Presentation  ?General Appearance: Appropriate for Environment ? ?Eye Contact:Good ? ?Speech:Clear and Coherent ? ?Speech Volume:Normal ? ?Handedness:Right ? ?Mood and Affect  ?Mood:Euthymic ? ?Affect:Appropriate ? ?Thought Process  ?Thought Processes:Coherent ? ?Descriptions of  Associations:Intact ? ?Orientation:Full (Time, Place and Person) ? ?Thought Content:Logical ? ?History of Schizophrenia/Schizoaffective disorder:No data recorded ?Duration of Psychotic Symptoms:No data recorded ?Hallucinations:Hallucinations: None ? ?Ideas of Reference:None ? ?Suicidal Thoughts:Suicidal Thoughts: No ? ?Homicidal Thoughts:Homicidal Thoughts: No ? ?Sensorium  ?Memory:Immediate Good; Remote Good; Recent Good ? ?Judgment:Good ? ?Insight:Good ? ?Executive Functions  ?Concentration:Good ? ?Attention Span:Good ? ?Recall:Good ? ?Fund of Knowledge:Good ? ?Language:Good ? ?Psychomotor Activity  ?Psychomotor Activity:Psychomotor Activity: Normal ? ?Assets  ?Assets:Communication Skills; Housing ? ?Sleep  ?Sleep:Sleep: Good ?Number of Hours of Sleep: 7.75 ? ?Physical Exam: ?Physical Exam ?Constitutional:   ?   Appearance: Normal appearance.  ?HENT:  ?   Head: Normocephalic.  ?   Nose: Nose normal. No congestion or rhinorrhea.  ?Eyes:  ?   Pupils: Pupils are equal, round, and reactive to light.  ?Pulmonary:  ?   Effort: Pulmonary effort is normal. No respiratory distress.  ?Musculoskeletal:     ?   General: No swelling. Normal range of motion.  ?  Cervical back: Normal range of motion. No rigidity.  ?Neurological:  ?   Mental Status: He is alert and oriented to person, place, and time.  ?   Sensory: No sensory deficit.  ?Psychiatric:     ?   Behavior: Behavior normal.     ?   Thought Content: Thought content normal.  ? ?Review of Systems  ?Constitutional: Negative.  Negative for fever.  ?HENT: Negative.    ?Eyes: Negative.   ?Respiratory: Negative.  Negative for cough.   ?Cardiovascular: Negative.  Negative for chest pain.  ?Gastrointestinal: Negative.   ?Genitourinary: Negative.   ?Musculoskeletal: Negative.   ?Skin: Negative.   ?Neurological: Negative.   ?Psychiatric/Behavioral: Negative.  Negative for depression (Patient denies SI/HI/AVH and verbally contracts for safety outside of the hospital. It is  currently not a danger to self or others, and denies making remarks that he would kill self or girlfriend prior to admission), substance abuse and suicidal ideas. The patient is not nervous/anxious.   ?Blood pressure (!) 127/1

## 2021-11-14 NOTE — Progress Notes (Signed)
RN reviewed Vanessa Fiscus', ([employer] Nursing Student) flowsheet documenation from today's pt assessment.  RN in agreement with Vanessa's documentation and pt's plan of care. ?

## 2021-11-14 NOTE — BHH Suicide Risk Assessment (Signed)
[facility] Discharge Suicide Risk Assessment ? ? ?Principal Problem: Alcohol abuse with alcohol-induced mood disorder (HCC) ?Discharge Diagnoses: Principal Problem: ?  Alcohol abuse with alcohol-induced mood disorder (HCC) ?Active Problems: ?  Homicidal ideation ?  Cocaine use ?  Substance induced mood disorder (HCC) ?  Vitamin D deficiency ? ? ?Total Time spent with patient: 15 minutes ? ?Musculoskeletal: ?Strength & Muscle Tone: within normal limits ?Gait & Station: normal ?Patient leans: N/A ? ?Psychiatric Specialty Exam ? ?Presentation  ?General Appearance: Casual; Fairly Groomed; Appropriate for Environment ? ?Eye Contact:Good ? ?Speech:Clear and Coherent; Normal Rate ? ?Speech Volume:Normal ? ?Handedness:Right ? ? ?Mood and Affect  ?Mood:-- (Anxious about getting discharged.) ? ?Duration of Depression Symptoms: No data recorded ?Affect:Congruent ? ? ?Thought Process  ?Thought Processes:Coherent; Goal Directed ? ?Descriptions of Associations:Intact ? ?Orientation:Full (Time, Place and Person) ? ?Thought Content:Logical ? ?History of Schizophrenia/Schizoaffective disorder:No data recorded ?Duration of Psychotic Symptoms:No data recorded ?Hallucinations:Hallucinations: None ? ?Ideas of Reference:None ? ?Suicidal Thoughts:Suicidal Thoughts: No ? ?Homicidal Thoughts:Homicidal Thoughts: No ? ? ?Sensorium  ?Memory:Immediate Good; Recent Good; Remote Good ? ?Judgment:Fair ? ?Insight:Poor ? ? ?Executive Functions  ?Concentration:Fair ? ?Attention Span:Fair ? ?Recall:Good ? ?Fund of Knowledge:Good ? ?Language:Good ? ? ?Psychomotor Activity  ?Psychomotor Activity:Psychomotor Activity: Normal ? ? ?Assets  ?Assets:Communication Skills; Desire for Improvement; Housing; Social Support; Physical Health ? ? ?Sleep  ?Sleep:Sleep: Good ?Number of Hours of Sleep: 7.75 ? ? ?Physical Exam: ?Physical Exam ?Vitals and nursing note reviewed.  ?Constitutional:   ?   Appearance: Normal appearance.  ?HENT:  ?   Head: Normocephalic.  ?Eyes:  ?    Extraocular Movements: Extraocular movements intact.  ?Cardiovascular:  ?   Rate and Rhythm: Normal rate.  ?Pulmonary:  ?   Effort: Pulmonary effort is normal.  ?Musculoskeletal:     ?   General: Normal range of motion.  ?   Cervical back: Normal range of motion.  ?Neurological:  ?   General: No focal deficit present.  ?   Mental Status: He is alert and oriented to person, place, and time.  ?Psychiatric:     ?   Behavior: Behavior normal.     ?   Thought Content: Thought content normal.     ?   Judgment: Judgment normal.  ? ?Review of Systems  ?Constitutional:  Negative for chills and fever.  ?HENT:  Negative for hearing loss.   ?Eyes:  Negative for blurred vision.  ?Respiratory:  Negative for cough.   ?Cardiovascular:  Negative for chest pain and palpitations.  ?Gastrointestinal:  Negative for constipation and diarrhea.  ?Genitourinary:  Negative for dysuria.  ?Musculoskeletal:  Negative for back pain and myalgias.  ?Skin:  Negative for rash.  ?Neurological:  Negative for headaches.  ?Psychiatric/Behavioral:  Positive for substance abuse. Negative for depression, hallucinations and suicidal ideas.   ?Blood pressure (!) 127/108, pulse 87, temperature 98.5 ?F (36.9 ?C), resp. rate 18, height 5\' 10"  (1.778 m), weight 93.9 kg, SpO2 100 %. Body mass index is 29.7 kg/m?. ? ?Mental Status Per Nursing Assessment::   ?On Admission:  Suicidal ideation indicated by others ? ?Demographic Factors:  ?Male ? ?Loss Factors: ?NA ? ?Historical Factors: ?NA ? ?Risk Reduction Factors:   ?Responsible for children under 61 years of age, Sense of responsibility to family, Living with another person, especially a relative, and Positive therapeutic relationship ? ?Continued Clinical Symptoms:  ?Alcohol/Substance Abuse/Dependencies ? ?Cognitive Features That Contribute To Risk:  ?Closed-mindedness   ? ?Suicide Risk:  ?Minimal: No identifiable  suicidal ideation.  Patients presenting with no risk factors but with morbid ruminations; may be  classified as minimal risk based on the severity of the depressive symptoms ? ? Follow-up Information   ? ? [facility]. Go to.   ?Specialty: Behavioral Health ?Why: You may go to this provider for therapy and medication management services at walk in time:  Mondays and Wednesdays at 7:30 am.  Services are provided on a first come, first served basis. ?Contact information: ?[address] ?[place] [address] ?[phone] ? ?  ?  ? ? [facility]. Go to.   ?Specialty: Behavioral Health ?Why: You may go to this agency to inquire about substance use services that accept your insurance. ?Contact information: ?[address] ?[address] ?[place] [address] ?[phone] ? ? ?  ?  ? ?  ?  ? ?  ? ? ?Plan Of Care/Follow-up recommendations:  ?Activity:  ad lib ?Diet:  low sodium, low fat   ? ?Prescriptions for new medications provided for the patient to bridge to follow up appointment. The patient was informed that refills for these prescriptions are generally not provided, and patient is encouraged to attend all follow up appointments to address medication refills and adjustments.  ? ?Today's discharge was reviewed with treatment team, and the team is in agreement that the patient is ready for discharge. The patient is was of the discharge plan for today and has been given opportunity to ask questions. At time of discharge, the patient does not vocalize any acute harm to self or others, is goal directed, able to advocate for self and organizational baseline.  ? ?At discharge, the patient is instructed to:  ?Take all medications as prescribed. ?Report any adverse effects and or reactions from the medicines to her outpatient provider promptly.  ?Do not engage in alcohol and/or illegal drug use while on prescription medicines.  ?In the event of worsening symptoms, patient is instructed to call the crisis hotline, 911 and or go to the nearest ED for appropriate evaluation  and treatment of symptoms.  ?Follow-up with primary care provider for further care of medical issues, concerns and or health care needs. ?* Substance abuse follow up: it is recommended that you follow up with community support treatment, like AA/NA. It is also recommended that the patient attend 90 meetings in 90 days, otherwise known as "90 in 90"  ? ?Roselle Locus, MD ?11/14/2021, 9:37 AM ?

## 2021-11-14 NOTE — Progress Notes (Signed)
?  [facility] Adult Case Management Discharge Plan : ? ?Will you be returning to the same living situation after discharge:  Yes,  Home  ?At discharge, do you have transportation home?: Yes,  Taxi  ?Do you have the ability to pay for your medications: Yes,  Employment  ? ?Release of information consent forms completed and in the chart;  Patient's signature needed at discharge. ? ?Patient to Follow up at: ? Follow-up Information   ? ? [place]. Go to.   ?Specialty: Behavioral Health ?Why: You may go to this provider for therapy and medication management services at walk in time:  Mondays and Wednesdays at 7:30 am.  Services are provided on a first come, first served basis. ?Contact information: ?[address] ?[place] [address] ?[phone] ? ?  ?  ? ? [facility] Go to.   ?Specialty: Behavioral Health ?Why: You may go to this agency to inquire about substance use services that accept your insurance. ?Contact information: ?[address] ?[place] [address] ?[phone] ? ? ?  ?  ? ?  ?  ? ?  ? ? ?Next level of care provider has access to [facility] ? ?Safety Planning and Suicide Prevention discussed: Yes,  with patient and mother  ? ?  ? ?Has patient been referred to the Quitline?: Patient refused referral ? ?Patient has been referred for addiction treatment: Yes ? ?Darleen Crocker, LCSWA ?11/14/2021, 10:39 AM ?

## 2021-11-14 NOTE — BHH Counselor (Signed)
CSW provided the Pt with a list of AA meetings in the [place] area that he can attend bot online and in-person.  CSW also explained the Pt's follow-up instructions to him. The Pt stated understanding of this information.  ?

## 2021-11-14 NOTE — BHH Group Notes (Signed)
Spiritual care group on grief and loss facilitated by chaplain Claudell Kyle, MDiv ? ?Group Goal:  ?Support / Education around grief and loss  ? ?Members engage in facilitated group support and psycho-social education.  ? ?Group Description:  ?Following introductions and group rules, group members engaged in facilitated group dialog and support around topic of loss, with particular support around experiences of loss in their lives. Group Identified types of loss (relationships / self / things) and identified patterns, circumstances, and changes that precipitate losses. Reflected on thoughts / feelings around loss, normalized grief responses, and recognized variety in grief experience. Group noted Worden's four tasks of grief in discussion.  ? ?Group drew on Adlerian / Rogerian, narrative, MI, Worden's Tasks of Grief ? ?Patient Progress: Allen Henson demonstrated mostly passive participation, making few comments while demonstrating engagement and active listening. Pt f/u with chaplain after group for further conversation. ?

## 2021-11-14 NOTE — Group Note (Signed)
Recreation Therapy Group Note ? ? ?Group Topic:Team Building  ?Group Date: 11/14/2021 ?Start Time: 0915 ?End Time: 1000 ?Facilitators: Victorino Sparrow, LRT,CTRS ?Location: [facility] ? ? ?Goal Area(s) Addresses:  ?Patient will effectively work with peer towards shared goal.  ?Patient will identify skills used to make activity successful.  ?Patient will identify how skills used during activity can be used to reach post d/c goals.  ? ?Group Description: Straw Bridge. In teams of 3-5, patients were given 15 plastic drinking straws and an equal length of masking tape. Using the materials provided, patients were instructed to build a free standing bridge-like structure to suspend an everyday item (ex: puzzle box) off of the floor or table surface. All materials were required to be used by the team in their design. LRT facilitated post-activity discussion reviewing team process. Patients were encouraged to reflect how the skills used in this activity can be generalized to daily life post discharge.  ? ? ?Affect/Mood: Appropriate ?  ?Participation Level: Engaged ?  ?Participation Quality: Independent ?  ?Behavior: Appropriate ?  ?Speech/Thought Process: Focused ?  ?Insight: Good ?  ?Judgement: Good ?  ?Modes of Intervention: Music and Team-building ?  ?Patient Response to Interventions:  Engaged ?  ?Education Outcome: ? Acknowledges education and In group clarification offered   ? ?Clinical Observations/Individualized Feedback: Pt came up with the basis of the bridge for his group.  Pt listened to any suggestions peers offered during the activity.  Pt was social and engaged with peers during group session.    ? ? ?Plan: Continue to engage patient in RT group sessions 2-3x/week. ? ? ?Victorino Sparrow, LRT,CTRS ?11/14/2021 1:00 PM ?

## 2021-11-14 NOTE — Progress Notes (Signed)
?   11/14/21 1145  ?Clinical Encounter Type  ?Visited With Patient  ?Visit Type Initial;Psychological support;Spiritual support  ?Referral From Patient  ?Consult/Referral To Chaplain  ?Spiritual Encounters  ?Spiritual Needs Other (Comment) ?(social support)  ? ?Chaplain Burris offered compassionate presence and social support to Pt who stayed after group intervention for further discussion. Chaplain Burris provided active listening as Pt discussed some social challenges to his well-being. Overall, Pt identified his strengths and demonstrated good awareness of the things that challenge him. Chaplain B lifted up those strengths and highlighted those through reflective listening. Chaplain B offered appreciation for Pt's willingness to share and offered words of encouragement. ?

## 2021-11-14 NOTE — Progress Notes (Signed)
RN met with pt and reviewed pt's discharge instructions.  Pt verbalized understanding of discharge instructions and pt did not have any questions. RN reviewed and provided pt with a copy of SRA, AVS and Transition Record.  RN returned pt's belongings to pt.  Pt denied SI/HI/AVH and voiced no concerns.   Patient discharged to the lobby without incident. 

## 2021-11-14 NOTE — Progress Notes (Signed)
Adult Psychoeducational Group Note ? ?Date:  11/14/2021 ?Time:  12:12 AM ? ?Group Topic/Focus:  ?Wrap-Up Group:   The focus of this group is to help patients review their daily goal of treatment and discuss progress on daily workbooks. ? ?Participation Level:  Active ? ?Participation Quality:  Appropriate and Attentive ? ?Affect:  Appropriate ? ?Cognitive:  Appropriate ? ?Insight: Appropriate ? ?Engagement in Group:  Engaged ? ?Modes of Intervention:  Discussion ? ?Additional Comments:   ?Pt was engaged and social during group. Pt rated his day at a 9/10 and stated that his goals for treatment were to continue to take his BP medication and to attend his substance abuse groups once he is discharged. Pt was encouraged to set up a plan in order to execute and maintain his goals by the writer.  ? ?Vevelyn Pat ?11/14/2021, 12:12 AM ?

## 2021-11-14 NOTE — B[facility] Suicide Risk Assessment (Signed)
B[facility] INPATIENT:  Family/Significant Other Suicide Prevention Education ? ?Suicide Prevention Education:  ?Education Completed; Zeffier Mapp [phone] (Mother) has been identified by the patient as the family member/significant other with whom the patient will be residing, and identified as the person(s) who will aid the patient in the event of a mental health crisis (suicidal ideations/suicide attempt).  With written consent from the patient, the family member/significant other has been provided the following suicide prevention education, prior to the and/or following the discharge of the patient. ? ?The suicide prevention education provided includes the following: ?Suicide risk factors ?Suicide prevention and interventions ?National Suicide Hotline telephone number ?[facility] assessment telephone number ?[facility] Emergency Assistance 911 ?[place] and/or Residential Mobile Crisis Unit telephone number ? ?Request made of family/significant other to: ?Remove weapons (e.g., guns, rifles, knives), all items previously/currently identified as safety concern.   ?Remove drugs/medications (over-the-counter, prescriptions, illicit drugs), all items previously/currently identified as a safety concern. ? ?The family member/significant other verbalizes understanding of the suicide prevention education information provided.  The family member/significant other agrees to remove the items of safety concern listed above. ? ?CSW spoke with Mrs. Mapp who states that her son has been using alcohol regularly.  She states that when he drinks he often begins thinking about past traumas and becomes upset with his wife and children.  She states that he does not share his thoughts and feelings with other people and he does not like taking medications, especially pills or tablets.  Mrs. Mapp states that she believes counseling would be beneficial for her son.  She reports that her son has no known firearms or  weapons in his possession.  CSW completed SPE with Mrs. Mapp.  ? ?Aram Beecham ?11/14/2021, 11:24 AM ?

## 2022-11-26 ENCOUNTER — Other Ambulatory Visit: Payer: Self-pay

## 2022-11-26 ENCOUNTER — Emergency Department (HOSPITAL_COMMUNITY)
Admission: EM | Admit: 2022-11-26 | Discharge: 2022-11-26 | Disposition: A | Discharge status: Home / Self Care | Payer: Self-pay | Attending: Emergency Medicine | Admitting: Emergency Medicine

## 2022-11-26 ENCOUNTER — Encounter (HOSPITAL_COMMUNITY): Payer: Self-pay

## 2022-11-26 DIAGNOSIS — Z91040 Latex allergy status: Secondary | ICD-10-CM | POA: Insufficient documentation

## 2022-11-26 DIAGNOSIS — W260XXA Contact with knife, initial encounter: Secondary | ICD-10-CM | POA: Insufficient documentation

## 2022-11-26 DIAGNOSIS — S61011A Laceration without foreign body of right thumb without damage to nail, initial encounter: Secondary | ICD-10-CM | POA: Insufficient documentation

## 2022-11-26 MED ORDER — BACITRACIN ZINC 500 UNIT/GM EX OINT: 1.0000 | TOPICAL_OINTMENT | Freq: Two times a day (BID) | CUTANEOUS | 0 refills | Status: AC

## 2022-11-26 MED ORDER — TETANUS-DIPHTH-ACELL PERTUSSIS 5-2.5-18.5 LF-MCG/0.5 IM SUSY
0.5000 mL | PREFILLED_SYRINGE | Freq: Once | INTRAMUSCULAR | Status: DC
  Filled 2022-11-26: qty 0.5

## 2022-11-26 NOTE — ED Notes (Signed)
Pt refused tdap booster at this time.
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
## 2022-11-26 NOTE — ED Provider Notes (Signed)
[place] EMERGENCY DEPARTMENT AT [facility] Provider Note   CSN: [id number] Arrival date & time: 11/26/22  0030     History  Chief Complaint  Patient presents with   Laceration    Allen Henson is a 37 y.o. male.   Laceration Pt brought to ED by [facility] for thumb laceration.  He states he is fine. Uncertain  of last tdap.       Home Medications Prior to Admission medications   Medication Sig Start Date End Date Taking? Authorizing Provider  hydrOXYzine (ATARAX) 25 MG tablet Take 1 tablet (25 mg total) by mouth every 6 (six) hours as needed (anxiety/agitation or CIWA < or = 10). 11/14/21   Hill, Shelbie Hutching, MD  lisinopril (ZESTRIL) 5 MG tablet Take 1 tablet (5 mg total) by mouth daily. 11/15/21   Roselle Locus, MD  Multiple Vitamins-Minerals (B COMPLEX-C-E-ZINC) tablet Take 1 tablet by mouth daily. 11/14/21   Hill, Shelbie Hutching, MD  Vitamin D, Ergocalciferol, (DRISDOL) 1.25 MG (50000 UNIT) CAPS capsule Take 1 capsule (50,000 Units total) by mouth every 7 (seven) days. 11/14/21   Roselle Locus, MD      Allergies    Latex    Review of Systems   Review of Systems  Physical Exam Updated Vital Signs BP (!) 141/95 (BP Location: Left Arm)   Pulse (!) 101   Temp 98.3 F (36.8 C) (Oral)   Resp 17   Ht  (1.803 m)   Wt 102.1 kg   SpO2 98%   BMI 31.38 kg/m  Physical Exam Vitals and nursing note reviewed.  Constitutional:      General: He is not in acute distress.    Appearance: Normal appearance. He is not ill-appearing.  HENT:     Head: Normocephalic and atraumatic.  Eyes:     General: No scleral icterus.       Right eye: No discharge.        Left eye: No discharge.     Conjunctiva/sclera: Conjunctivae normal.  Pulmonary:     Effort: Pulmonary effort is normal.     Breath sounds: No stridor.  Skin:    Comments: R thumb pad w superficial laceration that is non-gaping. No actively bleeding.   Neurological:     Mental Status:  He is alert and oriented to person, place, and time. Mental status is at baseline.     ED Results / Procedures / Treatments   Labs (all labs ordered are listed, but only abnormal results are displayed) Labs Reviewed - No data to display  EKG None  Radiology No results found.  Procedures Procedures    Medications Ordered in ED Medications  Tdap (BOOSTRIX) injection 0.5 mL (has no administration in time range)    ED Course/ Medical Decision Making/ A&P                             Medical Decision Making Risk OTC drugs. Prescription drug management.   Pt brought to ED by [facility] for thumb laceration.  He states he is fine. Uncertain  of last tdap.    Updated on Tdap. Pt lac cleaned and dressed. Will heal. No abx needed.   Final Clinical Impression(s) / ED Diagnoses Final diagnoses:  Laceration of right thumb without foreign body without damage to nail, initial encounter    Rx / DC Orders ED Discharge Orders     None  Solon [place], [place] 11/26/22 1610    Cy Blamer, MD 11/26/22 [phone]

## 2022-11-26 NOTE — Discharge Instructions (Addendum)
Keep the wound clean with warm soap and water, dab dry and keep covered with a Band-Aid.  You can use a little bit of bacitracin ointment which I have written a prescription for on your thumb twice daily.
